# Patient Record
Sex: Female | Born: 1980 | Race: Black or African American | Hispanic: No | Marital: Married | Smoking: Former smoker
Health system: Southern US, Community
[De-identification: ages and names within clinical notes are randomized; demographics above are authoritative.]

## PROBLEM LIST (undated history)

## (undated) DIAGNOSIS — F32A Depression, unspecified: Secondary | ICD-10-CM

## (undated) DIAGNOSIS — F329 Major depressive disorder, single episode, unspecified: Secondary | ICD-10-CM

## (undated) DIAGNOSIS — A048 Other specified bacterial intestinal infections: Secondary | ICD-10-CM

## (undated) DIAGNOSIS — F419 Anxiety disorder, unspecified: Secondary | ICD-10-CM

## (undated) DIAGNOSIS — K449 Diaphragmatic hernia without obstruction or gangrene: Secondary | ICD-10-CM

## (undated) DIAGNOSIS — D649 Anemia, unspecified: Secondary | ICD-10-CM

## (undated) DIAGNOSIS — K219 Gastro-esophageal reflux disease without esophagitis: Secondary | ICD-10-CM

## (undated) DIAGNOSIS — M419 Scoliosis, unspecified: Secondary | ICD-10-CM

## (undated) HISTORY — PX: BARIATRIC SURGERY: SHX1103

---
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
## 2007-05-07 ENCOUNTER — Emergency Department: Payer: Self-pay

## 2007-05-09 ENCOUNTER — Emergency Department: Payer: Self-pay

## 2009-12-05 ENCOUNTER — Emergency Department: Payer: Self-pay | Admitting: Emergency Medicine

## 2009-12-26 ENCOUNTER — Emergency Department: Payer: Self-pay | Admitting: Emergency Medicine

## 2012-07-01 ENCOUNTER — Ambulatory Visit: Payer: Self-pay | Admitting: Family Medicine

## 2012-10-01 ENCOUNTER — Ambulatory Visit: Payer: Self-pay | Admitting: Obstetrics and Gynecology

## 2014-08-03 DIAGNOSIS — N946 Dysmenorrhea, unspecified: Secondary | ICD-10-CM | POA: Insufficient documentation

## 2014-08-03 DIAGNOSIS — N979 Female infertility, unspecified: Secondary | ICD-10-CM | POA: Insufficient documentation

## 2015-04-02 ENCOUNTER — Other Ambulatory Visit: Payer: Self-pay | Admitting: Family Medicine

## 2015-04-02 ENCOUNTER — Ambulatory Visit
Admission: RE | Admit: 2015-04-02 | Discharge: 2015-04-02 | Disposition: A | Discharge status: Home / Self Care | Payer: No Typology Code available for payment source | Source: Ambulatory Visit | Attending: Family Medicine | Admitting: Family Medicine

## 2015-04-02 DIAGNOSIS — D259 Leiomyoma of uterus, unspecified: Secondary | ICD-10-CM | POA: Insufficient documentation

## 2015-04-02 DIAGNOSIS — R1084 Generalized abdominal pain: Secondary | ICD-10-CM | POA: Diagnosis present

## 2015-04-02 MED ORDER — IOHEXOL 350 MG/ML SOLN
100.0000 mL | Freq: Once | INTRAVENOUS | Status: AC | PRN
Start: 2015-04-02 — End: 2015-04-02
  Administered 2015-04-02: 100 mL via INTRAVENOUS

## 2015-04-03 DIAGNOSIS — E611 Iron deficiency: Secondary | ICD-10-CM

## 2015-04-03 HISTORY — DX: Iron deficiency: E61.1

## 2015-06-30 ENCOUNTER — Encounter: Payer: Self-pay | Admitting: Emergency Medicine

## 2015-06-30 ENCOUNTER — Emergency Department: Payer: No Typology Code available for payment source

## 2015-06-30 ENCOUNTER — Emergency Department
Admission: EM | Admit: 2015-06-30 | Discharge: 2015-06-30 | Disposition: A | Discharge status: Home / Self Care | Payer: No Typology Code available for payment source | Attending: Emergency Medicine | Admitting: Emergency Medicine

## 2015-06-30 DIAGNOSIS — D259 Leiomyoma of uterus, unspecified: Secondary | ICD-10-CM | POA: Insufficient documentation

## 2015-06-30 DIAGNOSIS — Z791 Long term (current) use of non-steroidal anti-inflammatories (NSAID): Secondary | ICD-10-CM | POA: Insufficient documentation

## 2015-06-30 DIAGNOSIS — R102 Pelvic and perineal pain: Secondary | ICD-10-CM

## 2015-06-30 DIAGNOSIS — Z79899 Other long term (current) drug therapy: Secondary | ICD-10-CM | POA: Diagnosis not present

## 2015-06-30 DIAGNOSIS — Z880 Allergy status to penicillin: Secondary | ICD-10-CM | POA: Diagnosis not present

## 2015-06-30 DIAGNOSIS — Z87891 Personal history of nicotine dependence: Secondary | ICD-10-CM | POA: Diagnosis not present

## 2015-06-30 DIAGNOSIS — Z3202 Encounter for pregnancy test, result negative: Secondary | ICD-10-CM | POA: Diagnosis not present

## 2015-06-30 DIAGNOSIS — R109 Unspecified abdominal pain: Secondary | ICD-10-CM | POA: Diagnosis present

## 2015-06-30 HISTORY — DX: Scoliosis, unspecified: M41.9

## 2015-06-30 LAB — COMPREHENSIVE METABOLIC PANEL
ALT: 8 U/L — ABNORMAL LOW (ref 14–54)
ANION GAP: 5 (ref 5–15)
AST: 19 U/L (ref 15–41)
Albumin: 4 g/dL (ref 3.5–5.0)
Alkaline Phosphatase: 74 U/L (ref 38–126)
BUN: 13 mg/dL (ref 6–20)
CALCIUM: 8.5 mg/dL — AB (ref 8.9–10.3)
CO2: 26 mmol/L (ref 22–32)
Chloride: 107 mmol/L (ref 101–111)
Creatinine, Ser: 0.77 mg/dL (ref 0.44–1.00)
GFR calc Af Amer: >60 mL/min (ref >60)
GFR calc non Af Amer: >60 mL/min (ref >60)
Glucose, Bld: 104 mg/dL — ABNORMAL HIGH (ref 65–99)
Potassium: 3.9 mmol/L (ref 3.5–5.1)
SODIUM: 138 mmol/L (ref 135–145)
Total Bilirubin: 0.1 mg/dL — ABNORMAL LOW (ref 0.3–1.2)
Total Protein: 7.3 g/dL (ref 6.5–8.1)

## 2015-06-30 LAB — URINALYSIS COMPLETE WITH MICROSCOPIC (ARMC ONLY)
Bacteria, UA: NONE SEEN
Bilirubin Urine: NEGATIVE
GLUCOSE, UA: NEGATIVE mg/dL
Hgb urine dipstick: NEGATIVE
KETONES UR: NEGATIVE mg/dL
LEUKOCYTES UA: NEGATIVE
NITRITE: NEGATIVE
Protein, ur: NEGATIVE mg/dL
Specific Gravity, Urine: 1.019 (ref 1.005–1.030)
pH: 5 (ref 5.0–8.0)

## 2015-06-30 LAB — CBC
HCT: 39 % (ref 35.0–47.0)
HEMOGLOBIN: 12 g/dL (ref 12.0–16.0)
MCH: 24.4 pg — ABNORMAL LOW (ref 26.0–34.0)
MCHC: 30.9 g/dL — AB (ref 32.0–36.0)
MCV: 79 fL — AB (ref 80.0–100.0)
PLATELETS: 318 10*3/uL (ref 150–440)
RBC: 4.94 MIL/uL (ref 3.80–5.20)
RDW: 23.1 % — AB (ref 11.5–14.5)
WBC: 7.9 10*3/uL (ref 3.6–11.0)

## 2015-06-30 LAB — LIPASE, BLOOD: Lipase: 19 U/L — ABNORMAL LOW (ref 22–51)

## 2015-06-30 LAB — WET PREP, GENITAL
CLUE CELLS WET PREP: NONE SEEN
Trich, Wet Prep: NONE SEEN
YEAST WET PREP: NONE SEEN

## 2015-06-30 LAB — CHLAMYDIA/NGC RT PCR (ARMC ONLY)
Chlamydia Tr: NOT DETECTED
N gonorrhoeae: NOT DETECTED

## 2015-06-30 LAB — PREGNANCY, URINE: PREG TEST UR: NEGATIVE

## 2015-06-30 NOTE — ED Notes (Signed)
Patient with right lower quad abd pain that started yesterday. Patient denies nausea, vomiting or fevers.

## 2015-06-30 NOTE — ED Provider Notes (Signed)
[facility] Emergency Department Provider Note  ____________________________________________  Time seen: 7:30 AM  I have reviewed the triage vital signs and the nursing notes.   HISTORY  Chief Complaint Abdominal Pain    HPI Tammy Todd is a 34 y.o. female who presents with complaints of moderate right lower quadrant discomfort started yesterday. She reportedly was cramping in the morning so she stayed home from work and it improved during the day. When she woke up this morning and felt continued cramping so she decided to come to the emergency department. She denies fevers chills. No nausea vomiting. No vaginal discharge. No dysuria. Currently she does not have any pain which she attributes to taking a Vicodin at home     Past Medical History  Diagnosis Date  . Scoliosis     There are no active problems to display for this patient.   History reviewed. No pertinent past surgical history.  Current Outpatient Rx  Name  Route  Sig  Dispense  Refill  . diclofenac (VOLTAREN) 75 MG EC tablet   Oral   Take 75 mg by mouth 2 (two) times daily.         . ferrous fumarate (HEMOCYTE - 106 MG FE) 325 (106 FE) MG TABS tablet   Oral   Take 1 tablet by mouth 2 (two) times daily.         Marland Kitchen gabapentin (NEURONTIN) 300 MG capsule   Oral   Take 300 mg by mouth 3 (three) times daily.           Allergies Penicillins  No family history on file.  Social History Social History  Substance Use Topics  . Smoking status: Former Research scientist (life sciences)  . Smokeless tobacco: None  . Alcohol Use: No    Review of Systems  Constitutional: Negative for fever. Eyes: Negative for visual changes. ENT: Negative for sore throat Cardiovascular: Negative for chest pain. Respiratory: Negative for shortness of breath. Gastrointestinal: Positive for abdominal pain Genitourinary: Negative for dysuria. Musculoskeletal: Negative for back pain. Skin: Negative for  rash. Neurological: Negative for headaches  Psychiatric no anxiety    ____________________________________________   PHYSICAL EXAM:  VITAL SIGNS: ED Triage Vitals  Enc Vitals Group     BP 06/30/15 0501 119/74 mmHg     Pulse Rate 06/30/15 0501 60     Resp 06/30/15 0501 18     Temp 06/30/15 0501 98.1 F (36.7 C)     Temp Source 06/30/15 0501 Oral     SpO2 06/30/15 0501 100 %     Weight 06/30/15 0501 244 lb (110.678 kg)     Height 06/30/15 0501 5\' 4"  (1.626 m)     Head Cir --      Peak Flow --      Pain Score 06/30/15 0501 7     Pain Loc --      Pain Edu? --      Excl. in [place]? --      Constitutional: Alert and oriented. Well appearing and in no distress. Eyes: Conjunctivae are normal.  ENT   Head: Normocephalic and atraumatic.   Mouth/Throat: Mucous membranes are moist. Cardiovascular: Normal rate, regular rhythm. Normal and symmetric distal pulses are present in all extremities. No murmurs, rubs, or gallops. Respiratory: Normal respiratory effort without tachypnea nor retractions. Breath sounds are clear and equal bilaterally.  Gastrointestinal: Very mild right lower quadrant discomfort. No distention. There is no CVA tenderness. Genitourinary: Positive white discharge around the cervix. No CMT  Musculoskeletal: Nontender with normal range of motion in all extremities. No lower extremity tenderness nor edema. Neurologic:  Normal speech and language. No gross focal neurologic deficits are appreciated. Skin:  Skin is warm, dry and intact. No rash noted. Psychiatric: Mood and affect are normal. Patient exhibits appropriate insight and judgment.  ____________________________________________    LABS (pertinent positives/negatives)  Labs Reviewed  LIPASE, BLOOD - Abnormal; Notable for the following:    Lipase 19 (*)    All other components within normal limits  COMPREHENSIVE METABOLIC PANEL - Abnormal; Notable for the following:    Glucose, Bld 104 (*)    Calcium  8.5 (*)    ALT 8 (*)    Total Bilirubin 0.1 (*)    All other components within normal limits  CBC - Abnormal; Notable for the following:    MCV 79.0 (*)    MCH 24.4 (*)    MCHC 30.9 (*)    RDW 23.1 (*)    All other components within normal limits  URINALYSIS COMPLETEWITH MICROSCOPIC ([facility] ONLY) - Abnormal; Notable for the following:    Color, Urine YELLOW (*)    APPearance CLEAR (*)    Squamous Epithelial / LPF 0-5 (*)    All other components within normal limits  PREGNANCY, URINE    ____________________________________________   EKG  None  ____________________________________________    RADIOLOGY I have personally reviewed any xrays that were ordered on this patient: Ultrasound pelvis shows thickened endometrium. I discussed the patient and she is to follow-up with her GYN for repeat study ____________________________________________   PROCEDURES  Procedure(s) performed: none  Critical Care performed: none  ____________________________________________   INITIAL IMPRESSION / ASSESSMENT AND PLAN / ED COURSE  Pertinent labs & imaging results that were available during my care of the patient were reviewed by me and considered in my medical decision making (see chart for details).  Patient with mild right lower quadrant discomfort which resolved and then returned early this morning. Her labs are normal. This is not consistent with appendicitis given she really doesn't have tenderness to palpation it's more pelvic discomfort. We'll obtain ultrasound pelvis for suspected ovarian cyst  ____________________________________________   FINAL CLINICAL IMPRESSION(S) / ED DIAGNOSES  Final diagnoses:  Pelvic pain in female     Lavonia Drafts, MD 06/30/15 1356

## 2015-06-30 NOTE — Discharge Instructions (Signed)
Abdominal Pain, Women °Abdominal (stomach, pelvic, or belly) pain can be caused by many things. It is important to tell your doctor: °· The location of the pain. °· Does it come and go or is it present all the time? °· Are there things that start the pain (eating certain foods, exercise)? °· Are there other symptoms associated with the pain (fever, nausea, vomiting, diarrhea)? °All of this is helpful to know when trying to find the cause of the pain. °CAUSES  °· Stomach: virus or bacteria infection, or ulcer. °· Intestine: appendicitis (inflamed appendix), regional ileitis (Crohn's disease), ulcerative colitis (inflamed colon), irritable bowel syndrome, diverticulitis (inflamed diverticulum of the colon), or cancer of the stomach or intestine. °· Gallbladder disease or stones in the gallbladder. °· Kidney disease, kidney stones, or infection. °· Pancreas infection or cancer. °· Fibromyalgia (pain disorder). °· Diseases of the female organs: °¨ Uterus: fibroid (non-cancerous) tumors or infection. °¨ Fallopian tubes: infection or tubal pregnancy. °¨ Ovary: cysts or tumors. °¨ Pelvic adhesions (scar tissue). °¨ Endometriosis (uterus lining tissue growing in the pelvis and on the pelvic organs). °¨ Pelvic congestion syndrome (female organs filling up with blood just before the menstrual period). °¨ Pain with the menstrual period. °¨ Pain with ovulation (producing an egg). °¨ Pain with an IUD (intrauterine device, birth control) in the uterus. °¨ Cancer of the female organs. °· Functional pain (pain not caused by a disease, may improve without treatment). °· Psychological pain. °· Depression. °DIAGNOSIS  °Your doctor will decide the seriousness of your pain by doing an examination. °· Blood tests. °· X-rays. °· Ultrasound. °· CT scan (computed tomography, special type of X-ray). °· MRI (magnetic resonance imaging). °· Cultures, for infection. °· Barium enema (dye inserted in the large intestine, to better view it with  X-rays). °· Colonoscopy (looking in intestine with a lighted tube). °· Laparoscopy (minor surgery, looking in abdomen with a lighted tube). °· Major abdominal exploratory surgery (looking in abdomen with a large incision). °TREATMENT  °The treatment will depend on the cause of the pain.  °· Many cases can be observed and treated at home. °· Over-the-counter medicines recommended by your caregiver. °· Prescription medicine. °· Antibiotics, for infection. °· Birth control pills, for painful periods or for ovulation pain. °· Hormone treatment, for endometriosis. °· Nerve blocking injections. °· Physical therapy. °· Antidepressants. °· Counseling with a psychologist or psychiatrist. °· Minor or major surgery. °HOME CARE INSTRUCTIONS  °· Do not take laxatives, unless directed by your caregiver. °· Take over-the-counter pain medicine only if ordered by your caregiver. Do not take aspirin because it can cause an upset stomach or bleeding. °· Try a clear liquid diet (broth or water) as ordered by your caregiver. Slowly move to a bland diet, as tolerated, if the pain is related to the stomach or intestine. °· Have a thermometer and take your temperature several times a day, and record it. °· Bed rest and sleep, if it helps the pain. °· Avoid sexual intercourse, if it causes pain. °· Avoid stressful situations. °· Keep your follow-up appointments and tests, as your caregiver orders. °· If the pain does not go away with medicine or surgery, you may try: °¨ Acupuncture. °¨ Relaxation exercises (yoga, meditation). °¨ Group therapy. °¨ Counseling. °SEEK MEDICAL CARE IF:  °· You notice certain foods cause stomach pain. °· Your home care treatment is not helping your pain. °· You need stronger pain medicine. °· You want your IUD removed. °· You feel faint or   lightheaded. °· You develop nausea and vomiting. °· You develop a rash. °· You are having side effects or an allergy to your medicine. °SEEK IMMEDIATE MEDICAL CARE IF:  °· Your  pain does not go away or gets worse. °· You have a fever. °· Your pain is felt only in portions of the abdomen. The right side could possibly be appendicitis. The left lower portion of the abdomen could be colitis or diverticulitis. °· You are passing blood in your stools (bright red or black tarry stools, with or without vomiting). °· You have blood in your urine. °· You develop chills, with or without a fever. °· You pass out. °MAKE SURE YOU:  °· Understand these instructions. °· Will watch your condition. °· Will get help right away if you are not doing well or get worse. °Document Released: 08/31/2007 Document Revised: 03/20/2014 Document Reviewed: 09/20/2009 °[employer]® Patient Information ©2015 [employer]. This information is not intended to replace advice given to you by your health care provider. Make sure you discuss any questions you have with your health care provider. ° °

## 2015-06-30 NOTE — ED Notes (Signed)
Pt back from U/S, laying in bed comfortably. Denies needs at this time. Will continue to assess.

## 2018-06-03 ENCOUNTER — Emergency Department
Admission: EM | Admit: 2018-06-03 | Discharge: 2018-06-03 | Disposition: A | Discharge status: Home / Self Care | Payer: BLUE CROSS/BLUE SHIELD | Attending: Emergency Medicine | Admitting: Emergency Medicine

## 2018-06-03 ENCOUNTER — Encounter: Payer: Self-pay | Admitting: Emergency Medicine

## 2018-06-03 ENCOUNTER — Emergency Department: Payer: BLUE CROSS/BLUE SHIELD

## 2018-06-03 DIAGNOSIS — R0602 Shortness of breath: Secondary | ICD-10-CM | POA: Insufficient documentation

## 2018-06-03 DIAGNOSIS — Z87891 Personal history of nicotine dependence: Secondary | ICD-10-CM | POA: Insufficient documentation

## 2018-06-03 DIAGNOSIS — R42 Dizziness and giddiness: Secondary | ICD-10-CM | POA: Diagnosis not present

## 2018-06-03 DIAGNOSIS — Z79899 Other long term (current) drug therapy: Secondary | ICD-10-CM | POA: Insufficient documentation

## 2018-06-03 DIAGNOSIS — R55 Syncope and collapse: Secondary | ICD-10-CM | POA: Diagnosis not present

## 2018-06-03 DIAGNOSIS — R531 Weakness: Secondary | ICD-10-CM | POA: Diagnosis present

## 2018-06-03 LAB — COMPREHENSIVE METABOLIC PANEL
ALT: 8 U/L (ref 0–44)
ANION GAP: 7 (ref 5–15)
AST: 15 U/L (ref 15–41)
Albumin: 3.5 g/dL (ref 3.5–5.0)
Alkaline Phosphatase: 65 U/L (ref 38–126)
BUN: 15 mg/dL (ref 6–20)
CO2: 23 mmol/L (ref 22–32)
Calcium: 9.2 mg/dL (ref 8.9–10.3)
Chloride: 112 mmol/L — ABNORMAL HIGH (ref 98–111)
Creatinine, Ser: 0.72 mg/dL (ref 0.44–1.00)
GFR calc non Af Amer: >60 mL/min (ref >60)
Glucose, Bld: 114 mg/dL — ABNORMAL HIGH (ref 70–99)
POTASSIUM: 4 mmol/L (ref 3.5–5.1)
Sodium: 142 mmol/L (ref 135–145)
TOTAL PROTEIN: 7 g/dL (ref 6.5–8.1)
Total Bilirubin: 0.2 mg/dL — ABNORMAL LOW (ref 0.3–1.2)

## 2018-06-03 LAB — CBC
HEMATOCRIT: 36.3 % (ref 35.0–47.0)
Hemoglobin: 12 g/dL (ref 12.0–16.0)
MCH: 26.6 pg (ref 26.0–34.0)
MCHC: 33.1 g/dL (ref 32.0–36.0)
MCV: 80.3 fL (ref 80.0–100.0)
Platelets: 308 10*3/uL (ref 150–440)
RBC: 4.52 MIL/uL (ref 3.80–5.20)
RDW: 17.6 % — AB (ref 11.5–14.5)
WBC: 7 10*3/uL (ref 3.6–11.0)

## 2018-06-03 LAB — TROPONIN I

## 2018-06-03 MED ORDER — IOHEXOL 300 MG/ML  SOLN
75.0000 mL | Freq: Once | INTRAMUSCULAR | Status: AC | PRN
  Administered 2018-06-03: 75 mL via INTRAVENOUS

## 2018-06-03 MED ORDER — ONDANSETRON HCL 4 MG/2ML IJ SOLN
4.0000 mg | Freq: Once | INTRAMUSCULAR | Status: AC
  Administered 2018-06-03: 4 mg via INTRAVENOUS
  Filled 2018-06-03: qty 2

## 2018-06-03 MED ORDER — SODIUM CHLORIDE 0.9 % IV SOLN
1000.0000 mL | Freq: Once | INTRAVENOUS | Status: AC
  Administered 2018-06-03: 1000 mL via INTRAVENOUS

## 2018-06-03 NOTE — ED Triage Notes (Signed)
Pt reports for several days has felt weak, dizzy and nauseated. Denies pain.

## 2018-06-03 NOTE — ED Notes (Signed)
ED Provider at bedside. 

## 2018-06-03 NOTE — ED Provider Notes (Signed)
[facility] Emergency Department Provider Note   ____________________________________________    I have reviewed the triage vital signs and the nursing notes.   HISTORY  Chief Complaint Weakness; Nausea; and Dizziness     HPI Tammy Todd is a 37 y.o. female who presents with complaints of weakness nausea and some shortness of breath.  Patient reports approximate 3 days ago she started feeling intermittent lightheadedness and diffusely weak.  She denies fevers or chills.  She does note some mild shortness of breath but this appears to be very gradually worsening over a long period of time.  She does not smoke.  No calf pain or swelling.  No pleurisy.  No chest pain.  No palpitations.  Today she got up and got in the shower and felt very hot and lightheaded and vomited once.  Currently she feels better.  No recent travel.  No history of heart disease.  She states she is not pregnant.  Has noticed a cough today   Past Medical History:  Diagnosis Date  . Scoliosis     There are no active problems to display for this patient.   History reviewed. No pertinent surgical history.  Prior to Admission medications   Medication Sig Start Date End Date Taking? Authorizing Provider  diclofenac (VOLTAREN) 75 MG EC tablet Take 75 mg by mouth 2 (two) times daily.    [provider]  ferrous fumarate (HEMOCYTE - 106 MG FE) 325 (106 FE) MG TABS tablet Take 1 tablet by mouth 2 (two) times daily.    [provider]  gabapentin (NEURONTIN) 300 MG capsule Take 300 mg by mouth 3 (three) times daily.    [provider]     Allergies Penicillins  No family history on file.  Social History Social History   Tobacco Use  . Smoking status: Former Smoker  Substance Use Topics  . Alcohol use: No  . Drug use: No    Review of Systems  Constitutional: No fever/chills Eyes: No visual changes.  ENT: No sore throat. Cardiovascular: No  chest pain no pleurisy no palpitations Respiratory: As above Gastrointestinal: No abdominal pain.  Positive nausea Genitourinary: Negative for dysuria. Musculoskeletal: Negative for calf pain Skin: Negative for rash. Neurological: Negative for headaches    ____________________________________________   PHYSICAL EXAM:  VITAL SIGNS: ED Triage Vitals  Enc Vitals Group     BP 06/03/18 0804 133/84     Pulse Rate 06/03/18 0804 74     Resp 06/03/18 0804 17     Temp 06/03/18 0804 98.7 F (37.1 C)     Temp Source 06/03/18 0804 Oral     SpO2 06/03/18 0804 99 %     Weight 06/03/18 0756 113.9 kg (251 lb)     Height 06/03/18 0756 1.6 m (5\' 3" )     Head Circumference --      Peak Flow --      Pain Score 06/03/18 0756 0     Pain Loc --      Pain Edu? --      Excl. in [place]? --     Constitutional: Alert and oriented. No acute distress. Pleasant and interactive  Nose: No congestion/rhinnorhea. Mouth/Throat: Mucous membranes are moist.   Cardiovascular: Normal rate, regular rhythm. Grossly normal heart sounds.  Good peripheral circulation. Respiratory: Normal respiratory effort.  No retractions. Lungs CTAB. Gastrointestinal: Soft and nontender. No distention.  No CVA tenderness. Genitourinary: deferred Musculoskeletal: No lower extremity tenderness nor edema.  Warm and well perfused Neurologic:  Normal speech and language. No gross focal neurologic deficits are appreciated.  Skin:  Skin is warm, dry and intact. No rash noted. Psychiatric: Mood and affect are normal. Speech and behavior are normal.  ____________________________________________   LABS (all labs ordered are listed, but only abnormal results are displayed)  Labs Reviewed  CBC - Abnormal; Notable for the following components:      Result Value   RDW 17.6 (*)    All other components within normal limits  COMPREHENSIVE METABOLIC PANEL - Abnormal; Notable for the following components:   Chloride 112 (*)    Glucose, Bld  114 (*)    Total Bilirubin 0.2 (*)    All other components within normal limits  TROPONIN I  URINALYSIS, COMPLETE (UACMP) WITH MICROSCOPIC   ____________________________________________  EKG  ED ECG REPORT I, Lavonia Drafts, the attending physician, personally viewed and interpreted this ECG.  Date: 06/03/2018  Rhythm: normal sinus rhythm QRS Axis: normal Intervals: normal ST/T Wave abnormalities: normal Narrative Interpretation: no evidence of acute ischemia  ____________________________________________  RADIOLOGY  Chest x-ray demonstrates asymmetric opacity CT chest is normal ____________________________________________   PROCEDURES  Procedure(s) performed: No  Procedures   Critical Care performed: No ____________________________________________   INITIAL IMPRESSION / ASSESSMENT AND PLAN / ED COURSE  Pertinent labs & imaging results that were available during my care of the patient were reviewed by me and considered in my medical decision making (see chart for details).  Patient well-appearing and in no acute distress.  Exam is reassuring.  Vital signs unremarkable.  Will check labs, chest x-ray, place the patient on cardiac monitor, give IV fluids and Zofran and reevaluate.  She did have an episode of diarrhea yesterday so I am suspicious of possible GI illness  ----------------------------------------- 10:10 AM on 06/03/2018 -----------------------------------------  Lab work is overall quite reassuring.  On chest x-ray asymmetric opacity noted by radiology, will send for CT with IV contrast given no evidence of infection  CT chest unremarkable, patient is reassured by this.  She feels quite well, will discharge with outpatient follow-up.  Return precautions discussed    ____________________________________________   FINAL CLINICAL IMPRESSION(S) / ED DIAGNOSES  Final diagnoses:  Near syncope  Dizziness        Note:  This document was prepared  using Dragon voice recognition software and may include unintentional dictation errors.    Lavonia Drafts, MD 06/03/18 1302

## 2018-11-03 DIAGNOSIS — Z6841 Body Mass Index (BMI) 40.0 and over, adult: Secondary | ICD-10-CM

## 2018-11-03 DIAGNOSIS — Z713 Dietary counseling and surveillance: Secondary | ICD-10-CM | POA: Insufficient documentation

## 2018-11-03 DIAGNOSIS — R635 Abnormal weight gain: Secondary | ICD-10-CM

## 2018-11-03 DIAGNOSIS — F411 Generalized anxiety disorder: Secondary | ICD-10-CM | POA: Insufficient documentation

## 2018-11-03 DIAGNOSIS — G8929 Other chronic pain: Secondary | ICD-10-CM | POA: Insufficient documentation

## 2018-11-03 HISTORY — DX: Morbid (severe) obesity due to excess calories: E66.01

## 2018-11-03 HISTORY — DX: Body Mass Index (BMI) 40.0 and over, adult: Z684

## 2018-11-03 HISTORY — DX: Abnormal weight gain: R63.5

## 2018-12-13 DIAGNOSIS — E559 Vitamin D deficiency, unspecified: Secondary | ICD-10-CM | POA: Insufficient documentation

## 2018-12-27 ENCOUNTER — Emergency Department
Admission: EM | Admit: 2018-12-27 | Discharge: 2018-12-27 | Disposition: A | Discharge status: Home / Self Care | Payer: 59 | Attending: Emergency Medicine | Admitting: Emergency Medicine

## 2018-12-27 ENCOUNTER — Other Ambulatory Visit: Payer: Self-pay

## 2018-12-27 ENCOUNTER — Encounter: Payer: Self-pay | Admitting: Emergency Medicine

## 2018-12-27 DIAGNOSIS — Z87891 Personal history of nicotine dependence: Secondary | ICD-10-CM | POA: Insufficient documentation

## 2018-12-27 DIAGNOSIS — M419 Scoliosis, unspecified: Secondary | ICD-10-CM | POA: Diagnosis not present

## 2018-12-27 DIAGNOSIS — M5442 Lumbago with sciatica, left side: Secondary | ICD-10-CM | POA: Diagnosis not present

## 2018-12-27 DIAGNOSIS — Z79899 Other long term (current) drug therapy: Secondary | ICD-10-CM | POA: Diagnosis not present

## 2018-12-27 DIAGNOSIS — G8929 Other chronic pain: Secondary | ICD-10-CM

## 2018-12-27 DIAGNOSIS — M5440 Lumbago with sciatica, unspecified side: Secondary | ICD-10-CM

## 2018-12-27 DIAGNOSIS — M5489 Other dorsalgia: Secondary | ICD-10-CM | POA: Diagnosis present

## 2018-12-27 MED ORDER — LIDOCAINE 5 % EX PTCH
1.0000 | MEDICATED_PATCH | CUTANEOUS | Status: DC
  Administered 2018-12-27: 1 via TRANSDERMAL
  Filled 2018-12-27: qty 1

## 2018-12-27 MED ORDER — OXYCODONE-ACETAMINOPHEN 7.5-325 MG PO TABS: 1.0000 | ORAL_TABLET | Freq: Four times a day (QID) | ORAL | 0 refills | Status: DC | PRN

## 2018-12-27 NOTE — ED Triage Notes (Signed)
Pt states lower back pain that began in Oct, burning in mid back started a little after that and progressively gotten worse. Pt walked to triage with no issues, NAD.

## 2018-12-27 NOTE — ED Provider Notes (Signed)
[facility] Emergency Department Provider Note   ____________________________________________   First MD Initiated Contact with Patient 12/27/18 [phone]     (approximate)  I have reviewed the triage vital signs and the nursing notes.   HISTORY  Chief Complaint Back Pain    HPI Tammy Todd is a 37 y.o. female patient presents with chronic back pain  with history of scoliosis.  Patient also states there is radicular component to the left lower extremity.  Patient denies bladder bowel dysfunction.  Patient is scheduled to see orthopedic for definitive evaluation and treatment in 2 days.  Patient is requesting pain relief until seen by orthopedics.  Patient rates the pain as a 7/10.  Patient described pain is "sharp".  No palliative measures for complaint.   Past Medical History:  Diagnosis Date  . Scoliosis     There are no active problems to display for this patient.   History reviewed. No pertinent surgical history.  Prior to Admission medications   Medication Sig Start Date End Date Taking? Authorizing Provider  diclofenac (VOLTAREN) 75 MG EC tablet Take 75 mg by mouth 2 (two) times daily.    [provider]  ferrous fumarate (HEMOCYTE - 106 MG FE) 325 (106 FE) MG TABS tablet Take 1 tablet by mouth 2 (two) times daily.    [provider]  gabapentin (NEURONTIN) 300 MG capsule Take 300 mg by mouth 3 (three) times daily.    [provider]  oxyCODONE-acetaminophen (PERCOCET) 7.5-325 MG tablet Take 1 tablet by mouth every 6 (six) hours as needed for severe pain. 12/27/18   Sable Feil, PA-C    Allergies Penicillins and Peanut-containing drug products  No family history on file.  Social History Social History   Tobacco Use  . Smoking status: Former Research scientist (life sciences)  . Smokeless tobacco: Never Used  Substance Use Topics  . Alcohol use: No  . Drug use: No    Review of Systems Constitutional: No fever/chills Eyes: No  visual changes. ENT: No sore throat. Cardiovascular: Denies chest pain. Respiratory: Denies shortness of breath. Gastrointestinal: No abdominal pain.  No nausea, no vomiting.  No diarrhea.  No constipation. Genitourinary: Negative for dysuria. Musculoskeletal: Positive for back pain. Skin: Negative for rash. Neurological: Negative for headaches, focal weakness or numbness. Allergic/Immunilogical: Penicillin and peanuts. ____________________________________________   PHYSICAL EXAM:  VITAL SIGNS: ED Triage Vitals  Enc Vitals Group     BP 12/27/18 0741 126/78     Pulse Rate 12/27/18 0741 85     Resp 12/27/18 0741 12     Temp 12/27/18 0741 98.5 F (36.9 C)     Temp Source 12/27/18 0741 Oral     SpO2 12/27/18 0741 99 %     Weight 12/27/18 0737 248 lb (112.5 kg)     Height 12/27/18 0737 5\' 3"  (1.6 m)     Head Circumference --      Peak Flow --      Pain Score 12/27/18 0737 7     Pain Loc --      Pain Edu? --      Excl. in [place]? --     Constitutional: Alert and oriented. Well appearing and in no acute distress. Cardiovascular: Normal rate, regular rhythm. Grossly normal heart sounds.  Good peripheral circulation. Respiratory: Normal respiratory effort.  No retractions. Lungs CTAB. Musculoskeletal: No lower extremity tenderness nor edema.  No joint effusions. Neurologic:  Normal speech and language. No gross focal neurologic deficits are appreciated.  No gait instability. Skin:  Skin is warm, dry and intact. No rash noted. Psychiatric: Mood and affect are normal. Speech and behavior are normal.  ____________________________________________   LABS (all labs ordered are listed, but only abnormal results are displayed)  Labs Reviewed - No data to display ____________________________________________  EKG   ____________________________________________  RADIOLOGY  ED MD interpretation:    Official radiology report(s): No results  found.  ____________________________________________   PROCEDURES  Procedure(s) performed: None  Procedures  Critical Care performed: No  ____________________________________________   INITIAL IMPRESSION / ASSESSMENT AND PLAN / ED COURSE  As part of my medical decision making, I reviewed the following data within the electronic MEDICAL RECORD NUMBER     Back pain secondary to scoliosis.  Patient given discharge care instruction advised to follow-up with schedule orthopedic appointment in 2 days.  Take medication as directed.      ____________________________________________   FINAL CLINICAL IMPRESSION(S) / ED DIAGNOSES  Final diagnoses:  Chronic midline low back pain with sciatica, sciatica laterality unspecified     ED Discharge Orders         Ordered    oxyCODONE-acetaminophen (PERCOCET) 7.5-325 MG tablet  Every 6 hours PRN     12/27/18 0819           Note:  This document was prepared using Dragon voice recognition software and may include unintentional dictation errors.    Sable Feil, PA-C 12/27/18 Marijo Conception    Lavonia Drafts, MD 12/27/18 [phone]

## 2018-12-27 NOTE — ED Notes (Signed)
See triage note  Presents with lower back pain  States pain started in Oct  describes as a burning pain  Ambulates well   Denies any recent injury

## 2019-08-26 ENCOUNTER — Encounter: Payer: Self-pay | Admitting: Podiatry

## 2019-08-26 ENCOUNTER — Ambulatory Visit: Payer: 59 | Admitting: Podiatry

## 2019-08-26 ENCOUNTER — Other Ambulatory Visit: Payer: Self-pay

## 2019-08-26 ENCOUNTER — Ambulatory Visit: Payer: 59

## 2019-08-26 DIAGNOSIS — R234 Changes in skin texture: Secondary | ICD-10-CM | POA: Diagnosis not present

## 2019-08-26 DIAGNOSIS — B353 Tinea pedis: Secondary | ICD-10-CM

## 2019-08-26 MED ORDER — TERBINAFINE HCL 250 MG PO TABS: 250.0000 mg | ORAL_TABLET | Freq: Every day | ORAL | 0 refills | Status: DC

## 2019-08-29 ENCOUNTER — Other Ambulatory Visit: Payer: Self-pay

## 2019-08-29 ENCOUNTER — Ambulatory Visit
Admission: EM | Admit: 2019-08-29 | Discharge: 2019-08-29 | Disposition: A | Discharge status: Home / Self Care | Payer: 59 | Attending: Family Medicine | Admitting: Family Medicine

## 2019-08-29 DIAGNOSIS — R05 Cough: Secondary | ICD-10-CM | POA: Diagnosis not present

## 2019-08-29 DIAGNOSIS — R0982 Postnasal drip: Secondary | ICD-10-CM | POA: Diagnosis not present

## 2019-08-29 DIAGNOSIS — R059 Cough, unspecified: Secondary | ICD-10-CM

## 2019-08-29 HISTORY — DX: Anemia, unspecified: D64.9

## 2019-08-29 HISTORY — DX: Gastro-esophageal reflux disease without esophagitis: K21.9

## 2019-08-29 HISTORY — DX: Diaphragmatic hernia without obstruction or gangrene: K44.9

## 2019-08-29 HISTORY — DX: Anxiety disorder, unspecified: F41.9

## 2019-08-29 HISTORY — DX: Other specified bacterial intestinal infections: A04.8

## 2019-08-29 MED ORDER — BENZONATATE 200 MG PO CAPS: 200.0000 mg | ORAL_CAPSULE | Freq: Three times a day (TID) | ORAL | 0 refills | Status: DC | PRN

## 2019-08-29 NOTE — ED Provider Notes (Signed)
[facility]    CSN: [id number] Arrival date & time: 08/29/19  0825      History   Chief Complaint Chief Complaint  Patient presents with  . Shortness of Breath    HPI Tammy Todd is a 38 y.o. female.   38 yo female with a c/o cough, shortness of breath and throat congestion sensation. Denies any fevers, chills, wheezing. States cough is worse at night. Denies any known sick contacts.   Shortness of Breath   Past Medical History:  Diagnosis Date  . Anemia   . Anxiety   . GERD (gastroesophageal reflux disease)   . H. pylori infection   . Hiatal hernia   . Scoliosis     Patient Active Problem List   Diagnosis Date Noted  . Vitamin D deficiency 12/13/2018  . Abnormal weight gain 11/03/2018  . Chronic midline low back pain without sciatica 11/03/2018  . Class 3 severe obesity due to excess calories with serious comorbidity and body mass index (BMI) of 45.0 to 49.9 in adult ([place]) 11/03/2018  . Encounter for weight loss counseling 11/03/2018  . Generalized anxiety disorder 11/03/2018  . Iron deficiency 04/03/2015  . Dysmenorrhea 08/03/2014  . Female fertility problem 08/03/2014    History reviewed. No pertinent surgical history.  OB History   No obstetric history on file.      Home Medications    Prior to Admission medications   Medication Sig Start Date End Date Taking? Authorizing Provider  Ascorbic Acid (VITAMIN C) 100 MG tablet Take by mouth.    [provider]  benzonatate (TESSALON) 200 MG capsule Take 1 capsule (200 mg total) by mouth 3 (three) times daily as needed. 08/29/19   Norval Gable, MD  buPROPion [facility] SR) 150 MG 12 hr tablet Take by mouth. 11/03/18   [provider]  cyclobenzaprine (FLEXERIL) 10 MG tablet cyclobenzaprine 10 mg tablet 09/08/18   [provider]  ferrous fumarate (HEMOCYTE - 106 MG FE) 325 (106 FE) MG TABS tablet Take 1 tablet by mouth 2 (two) times daily.    [provider]  gabapentin (NEURONTIN) 300 MG capsule Take 300 mg by mouth 3 (three) times daily.    [provider]  hydrOXYzine (ATARAX/VISTARIL) 10 MG tablet hydroxyzine HCl 10 mg tablet 07/18/18   [provider]  meloxicam (MOBIC) 15 MG tablet  10/05/18   [provider]  sertraline (ZOLOFT) 50 MG tablet  10/05/18   [provider]  terbinafine (LAMISIL) 250 MG tablet Take 1 tablet (250 mg total) by mouth daily. 08/26/19   Edrick Kins, DPM  traMADol Veatrice Bourbon) 50 MG tablet tramadol 50 mg tablet 09/08/18   [provider]    Family History History reviewed. No pertinent family history.  Social History Social History   Tobacco Use  . Smoking status: Former Research scientist (life sciences)  . Smokeless tobacco: Never Used  Substance Use Topics  . Alcohol use: Yes    Comment: social  . Drug use: No     Allergies   Cashew nut oil, Penicillins, Pistachio nut extract skin test, and Peanut-containing drug products   Review of Systems Review of Systems  Respiratory: Positive for shortness of breath.      Physical Exam Triage Vital Signs ED Triage Vitals  Enc Vitals Group     BP 08/29/19 0833 133/85     Pulse Rate 08/29/19 0833 93     Resp 08/29/19 0833 20     Temp 08/29/19 [id number]  98.5 F (36.9 C)     Temp Source 08/29/19 0833 Oral     SpO2 08/29/19 0833 97 %     Weight 08/29/19 0834 271 lb (122.9 kg)     Height 08/29/19 0834 5\' 3"  (1.6 m)     Head Circumference --      Peak Flow --      Pain Score 08/29/19 0834 0     Pain Loc --      Pain Edu? --      Excl. in [place]? --    No data found.  Updated Vital Signs BP 133/85 (BP Location: Left Arm)   Pulse 93   Temp 98.5 F (36.9 C) (Oral)   Resp 20   Ht 5\' 3"  (1.6 m)   Wt 122.9 kg   LMP 08/15/2019   SpO2 97%   BMI 48.01 kg/m   Visual Acuity Right Eye Distance:   Left Eye Distance:   Bilateral Distance:    Right Eye Near:   Left Eye Near:    Bilateral Near:     Physical Exam Vitals  signs and nursing note reviewed.  Constitutional:      General: She is not in acute distress.    Appearance: She is not ill-appearing, toxic-appearing or diaphoretic.  HENT:     Mouth/Throat:     Comments: Post nasal drainage Cardiovascular:     Rate and Rhythm: Normal rate and regular rhythm.  Pulmonary:     Effort: Pulmonary effort is normal. No respiratory distress.     Breath sounds: Normal breath sounds. No stridor. No wheezing, rhonchi or rales.  Neurological:     Mental Status: She is alert.      UC Treatments / Results  Labs (all labs ordered are listed, but only abnormal results are displayed) Labs Reviewed  NOVEL CORONAVIRUS, NAA (HOSP ORDER, SEND-OUT TO REF LAB; TAT 18-24 HRS)    EKG   Radiology No results found.  Procedures Procedures (including critical care time)  Medications Ordered in UC Medications - No data to display  Initial Impression / Assessment and Plan / UC Course  I have reviewed the triage vital signs and the nursing notes.  Pertinent labs & imaging results that were available during my care of the patient were reviewed by me and considered in my medical decision making (see chart for details).      Final Clinical Impressions(s) / UC Diagnoses   Final diagnoses:  Cough  Post-nasal drainage     Discharge Instructions     Over the counter steroid nasal spray and oral allergy medication Increase water intake    ED Prescriptions    Medication Sig Dispense Auth. Provider   benzonatate (TESSALON) 200 MG capsule Take 1 capsule (200 mg total) by mouth 3 (three) times daily as needed. 30 capsule Norval Gable, MD      1. diagnosis reviewed with patient 2. rx as per orders above; reviewed possible side effects, interactions, risks and benefits  3. Recommend supportive treatment as above 4. covid test done 5. Follow-up prn if symptoms worsen or don't improve  PDMP not reviewed this encounter.   Norval Gable, MD 08/29/19  [phone]

## 2019-08-29 NOTE — Discharge Instructions (Signed)
Over the counter steroid nasal spray and oral allergy medication Increase water intake

## 2019-08-29 NOTE — Progress Notes (Signed)
   HPI: 38 y.o. female presenting today as a new patient with a chief complaint of stinging, burning pain noted to the bilateral heels that began a few years ago. She reports severely dry, cracking skin of the heels. She reports some associated drainage, erythema and inflammation. Walking sometimes increases the pain. She has been using OTC lotions, soaking and applying Vaseline with no relief. Patient is here for further evaluation and treatment.   Past Medical History:  Diagnosis Date  . Anemia   . Anxiety   . GERD (gastroesophageal reflux disease)   . H. pylori infection   . Hiatal hernia   . Scoliosis      Physical Exam: General: The patient is alert and oriented x3 in no acute distress.  Dermatology: Pruritus noted to the bilateral heels of the with hyperkeratosis. Heel fissures noted bilaterally. Skin is warm, dry and supple bilateral lower extremities. Negative for open lesions or macerations.  Vascular: Palpable pedal pulses bilaterally. No edema or erythema noted. Capillary refill within normal limits.  Neurological: Epicritic and protective threshold grossly intact bilaterally.   Musculoskeletal Exam: Range of motion within normal limits to all pedal and ankle joints bilateral. Muscle strength 5/5 in all groups bilateral.   Assessment: 1. Tinea pedis bilateral 2. Heel fissures bilateral    Plan of Care:  1. Patient evaluated. X-Rays reviewed.  2. Prescription for Lamisil 250 mg #45 provided to patient.  3. Revitaderm 40% Urea provided to patient.  4. Recommended not going barefoot.  5. Return to clinic in 6 weeks.       Edrick Kins, DPM [facility]  Dr. Edrick Kins, DPM    [address], [place] [address]                Office [phone]  Fax [phone]

## 2019-08-29 NOTE — ED Triage Notes (Signed)
Shortness of breath x past few days. Dry cough and difficulty swallowing. "Feels tight when I swallow, but no pain." Pt states the cough sounds like her bronchitis. No fever.

## 2019-08-30 LAB — NOVEL CORONAVIRUS, NAA (HOSP ORDER, SEND-OUT TO REF LAB; TAT 18-24 HRS): SARS-CoV-2, NAA: NOT DETECTED

## 2019-09-07 DIAGNOSIS — G4719 Other hypersomnia: Secondary | ICD-10-CM | POA: Insufficient documentation

## 2019-09-07 DIAGNOSIS — K449 Diaphragmatic hernia without obstruction or gangrene: Secondary | ICD-10-CM | POA: Insufficient documentation

## 2019-09-07 DIAGNOSIS — R0683 Snoring: Secondary | ICD-10-CM | POA: Insufficient documentation

## 2019-09-07 DIAGNOSIS — K219 Gastro-esophageal reflux disease without esophagitis: Secondary | ICD-10-CM | POA: Insufficient documentation

## 2019-09-19 ENCOUNTER — Other Ambulatory Visit
Admission: RE | Admit: 2019-09-19 | Discharge: 2019-09-19 | Disposition: A | Discharge status: Home / Self Care | Payer: 59 | Source: Ambulatory Visit | Attending: Internal Medicine | Admitting: Internal Medicine

## 2019-09-19 ENCOUNTER — Other Ambulatory Visit: Payer: Self-pay

## 2019-09-19 DIAGNOSIS — Z01812 Encounter for preprocedural laboratory examination: Secondary | ICD-10-CM | POA: Diagnosis not present

## 2019-09-19 DIAGNOSIS — Z20828 Contact with and (suspected) exposure to other viral communicable diseases: Secondary | ICD-10-CM | POA: Insufficient documentation

## 2019-09-20 LAB — SARS CORONAVIRUS 2 (TAT 6-24 HRS): SARS Coronavirus 2: NEGATIVE

## 2019-09-21 ENCOUNTER — Encounter: Payer: Self-pay | Admitting: *Deleted

## 2019-09-22 ENCOUNTER — Encounter: Payer: Self-pay | Admitting: Certified Registered Nurse Anesthetist

## 2019-09-22 ENCOUNTER — Other Ambulatory Visit: Payer: Self-pay

## 2019-09-22 ENCOUNTER — Encounter
Admission: RE | Disposition: A | Discharge status: Home / Self Care | Payer: Self-pay | Source: Home / Self Care | Attending: Internal Medicine

## 2019-09-22 ENCOUNTER — Ambulatory Visit
Admission: RE | Admit: 2019-09-22 | Discharge: 2019-09-22 | Disposition: A | Discharge status: Home / Self Care | Payer: 59 | Attending: Internal Medicine | Admitting: Internal Medicine

## 2019-09-22 ENCOUNTER — Ambulatory Visit: Payer: 59 | Admitting: Certified Registered Nurse Anesthetist

## 2019-09-22 DIAGNOSIS — K640 First degree hemorrhoids: Secondary | ICD-10-CM | POA: Insufficient documentation

## 2019-09-22 DIAGNOSIS — F329 Major depressive disorder, single episode, unspecified: Secondary | ICD-10-CM | POA: Insufficient documentation

## 2019-09-22 DIAGNOSIS — Z791 Long term (current) use of non-steroidal anti-inflammatories (NSAID): Secondary | ICD-10-CM | POA: Insufficient documentation

## 2019-09-22 DIAGNOSIS — K219 Gastro-esophageal reflux disease without esophagitis: Secondary | ICD-10-CM | POA: Diagnosis not present

## 2019-09-22 DIAGNOSIS — K449 Diaphragmatic hernia without obstruction or gangrene: Secondary | ICD-10-CM | POA: Insufficient documentation

## 2019-09-22 DIAGNOSIS — N920 Excessive and frequent menstruation with regular cycle: Secondary | ICD-10-CM | POA: Insufficient documentation

## 2019-09-22 DIAGNOSIS — M419 Scoliosis, unspecified: Secondary | ICD-10-CM | POA: Insufficient documentation

## 2019-09-22 DIAGNOSIS — Z87891 Personal history of nicotine dependence: Secondary | ICD-10-CM | POA: Insufficient documentation

## 2019-09-22 DIAGNOSIS — Z91010 Allergy to peanuts: Secondary | ICD-10-CM | POA: Insufficient documentation

## 2019-09-22 DIAGNOSIS — Z880 Allergy status to penicillin: Secondary | ICD-10-CM | POA: Insufficient documentation

## 2019-09-22 DIAGNOSIS — D500 Iron deficiency anemia secondary to blood loss (chronic): Secondary | ICD-10-CM | POA: Diagnosis not present

## 2019-09-22 DIAGNOSIS — K591 Functional diarrhea: Secondary | ICD-10-CM | POA: Diagnosis not present

## 2019-09-22 DIAGNOSIS — F419 Anxiety disorder, unspecified: Secondary | ICD-10-CM | POA: Insufficient documentation

## 2019-09-22 DIAGNOSIS — Z79899 Other long term (current) drug therapy: Secondary | ICD-10-CM | POA: Insufficient documentation

## 2019-09-22 DIAGNOSIS — Z91018 Allergy to other foods: Secondary | ICD-10-CM | POA: Insufficient documentation

## 2019-09-22 DIAGNOSIS — Z6841 Body Mass Index (BMI) 40.0 and over, adult: Secondary | ICD-10-CM | POA: Insufficient documentation

## 2019-09-22 HISTORY — PX: ESOPHAGOGASTRODUODENOSCOPY (EGD) WITH PROPOFOL: SHX5813

## 2019-09-22 HISTORY — PX: COLONOSCOPY WITH PROPOFOL: SHX5780

## 2019-09-22 HISTORY — DX: Depression, unspecified: F32.A

## 2019-09-22 LAB — POCT PREGNANCY, URINE: Preg Test, Ur: NEGATIVE

## 2019-09-22 SURGERY — COLONOSCOPY WITH PROPOFOL
Anesthesia: General

## 2019-09-22 MED ORDER — LIDOCAINE HCL (CARDIAC) PF 100 MG/5ML IV SOSY
PREFILLED_SYRINGE | INTRAVENOUS | Status: DC | PRN
  Administered 2019-09-22: 50 mg via INTRAVENOUS

## 2019-09-22 MED ORDER — PROPOFOL 10 MG/ML IV BOLUS
INTRAVENOUS | Status: DC | PRN
  Administered 2019-09-22: 18 mg via INTRAVENOUS
  Administered 2019-09-22: 80 mg via INTRAVENOUS

## 2019-09-22 MED ORDER — PROPOFOL 500 MG/50ML IV EMUL
INTRAVENOUS | Status: AC
  Filled 2019-09-22: qty 50

## 2019-09-22 MED ORDER — PROPOFOL 500 MG/50ML IV EMUL
INTRAVENOUS | Status: DC | PRN
  Administered 2019-09-22: 130 ug/kg/min via INTRAVENOUS

## 2019-09-22 MED ORDER — MIDAZOLAM HCL 2 MG/2ML IJ SOLN
INTRAMUSCULAR | Status: AC
  Filled 2019-09-22: qty 2

## 2019-09-22 MED ORDER — MIDAZOLAM HCL 2 MG/2ML IJ SOLN
INTRAMUSCULAR | Status: DC | PRN
  Administered 2019-09-22: 2 mg via INTRAVENOUS

## 2019-09-22 MED ORDER — LIDOCAINE HCL (PF) 2 % IJ SOLN
INTRAMUSCULAR | Status: AC
  Filled 2019-09-22: qty 10

## 2019-09-22 MED ORDER — SODIUM CHLORIDE 0.9 % IV SOLN
INTRAVENOUS | Status: DC
  Administered 2019-09-22: 1000 mL via INTRAVENOUS

## 2019-09-22 NOTE — Transfer of Care (Signed)
Immediate Anesthesia Transfer of Care Note  Patient: TOREE SANTOR  Procedure(s) Performed: COLONOSCOPY WITH PROPOFOL (N/A ) ESOPHAGOGASTRODUODENOSCOPY (EGD) WITH PROPOFOL (N/A )  Patient Location: PACU and Endoscopy Unit  Anesthesia Type:General  Level of Consciousness: drowsy  Airway & Oxygen Therapy: Patient Spontanous Breathing  Post-op Assessment: Report given to RN and Post -op Vital signs reviewed and stable  Post vital signs: Reviewed and stable  Last Vitals:  Vitals Value Taken Time  BP 113/68 09/22/19 1203  Temp 36.4 C 09/22/19 1203  Pulse 100 09/22/19 1203  Resp 27 09/22/19 1203  SpO2 99 % 09/22/19 1203  Vitals shown include unvalidated device data.  Last Pain:  Vitals:   09/22/19 1203  TempSrc: Temporal  PainSc: 0-No pain         Complications: No apparent anesthesia complications

## 2019-09-22 NOTE — Op Note (Signed)
[facility] Gastroenterology Patient Name: Tammy Todd Procedure Date: 09/22/2019 11:32 AM MRN: [id number] Account #: [id number] Date of Birth: [birth year] Admit Type: Outpatient Age: 38 Room: [facility] ENDO ROOM 2 Gender: Female Note Status: Finalized Procedure:             Upper GI endoscopy Indications:           Epigastric abdominal pain, Iron deficiency anemia                         secondary to chronic blood loss Providers:             Benay Pike. Alice Reichert MD, MD Referring MD:          Gayland Curry MD, MD (Referring MD) Medicines:             Propofol per Anesthesia Complications:         No immediate complications. Procedure:             Pre-Anesthesia Assessment:                        - The risks and benefits of the procedure and the                         sedation options and risks were discussed with the                         patient. All questions were answered and informed                         consent was obtained.                        - Patient identification and proposed procedure were                         verified prior to the procedure by the nurse. The                         procedure was verified in the procedure room.                        - ASA Grade Assessment: III - A patient with severe                         systemic disease.                        - After reviewing the risks and benefits, the patient                         was deemed in satisfactory condition to undergo the                         procedure.                        After obtaining informed consent, the endoscope was                         passed under direct  vision. Throughout the procedure,                         the patient's blood pressure, pulse, and oxygen                         saturations were monitored continuously. The Endoscope                         was introduced through the mouth, and advanced to the                         third part of  duodenum. The upper GI endoscopy was                         accomplished without difficulty. The patient tolerated                         the procedure well. Findings:      The esophagus was normal.      A 2 cm hiatal hernia was present.      The examined duodenum was normal. Impression:            - Normal esophagus.                        - 2 cm hiatal hernia.                        - Normal examined duodenum.                        - No specimens collected. Recommendation:        - Proceed with colonoscopy Procedure Code(s):     --- Professional ---                        [phone], Esophagogastroduodenoscopy, flexible,                         transoral; diagnostic, including collection of                         specimen(s) by brushing or washing, when performed                         (separate procedure) Diagnosis Code(s):     --- Professional ---                        D50.0, Iron deficiency anemia secondary to blood loss                         (chronic)                        R10.13, Epigastric pain                        K44.9, Diaphragmatic hernia without obstruction or                         gangrene CPT copyright 2019 American  Medical Association. All rights reserved. The codes documented in this report are preliminary and upon coder review may  be revised to meet current compliance requirements. Efrain Sella MD, MD 09/22/2019 11:45:21 AM This report has been signed electronically. Number of Addenda: 0 Note Initiated On: 09/22/2019 11:32 AM Estimated Blood Loss:  Estimated blood loss: none.      [facility]

## 2019-09-22 NOTE — Anesthesia Post-op Follow-up Note (Signed)
Anesthesia QCDR form completed.        

## 2019-09-22 NOTE — H&P (Signed)
Outpatient short stay form Pre-procedure 09/22/2019 10:58 AM Tammy Todd, M.D.  Primary Physician: Gayland Curry, M.D.  Reason for visit:  Change in bowel habits, functional diarrhea  History of present illness: 38 y/o AAF presents for persistent diarrhea with IBS-D symptoms responsive to fiber supplementation, probiotics and dicyclomine. Infectious workup of diarrhea was negative. No rectal bleeding. Patient has low ferritin owed to menorrhagia symptoms. Has hx of stable GERD without alarm symptoms.     Current Facility-Administered Medications:  .  0.9 %  sodium chloride infusion, , Intravenous, Continuous, Sheneka Schrom, Benay Pike, MD  Medications Prior to Admission  Medication Sig Dispense Refill Last Dose  . Ascorbic Acid (VITAMIN C) 100 MG tablet Take by mouth.   Past Week at Unknown time  . benzonatate (TESSALON) 200 MG capsule Take 1 capsule (200 mg total) by mouth 3 (three) times daily as needed. 30 capsule 0 Past Week at Unknown time  . buPROPion (WELLBUTRIN SR) 150 MG 12 hr tablet Take by mouth.   Past Week at Unknown time  . cyclobenzaprine (FLEXERIL) 10 MG tablet cyclobenzaprine 10 mg tablet   Past Week at Unknown time  . dicyclomine (BENTYL) 10 MG capsule Take 10 mg by mouth 4 (four) times daily -  before meals and at bedtime.   Past Week at Unknown time  . ergocalciferol (VITAMIN D2) 1.25 MG (50000 UT) capsule Take 50,000 Units by mouth once a week.   Past Week at Unknown time  . ferrous fumarate (HEMOCYTE - 106 MG FE) 325 (106 FE) MG TABS tablet Take 1 tablet by mouth 2 (two) times daily.   Past Week at Unknown time  . gabapentin (NEURONTIN) 300 MG capsule Take 300 mg by mouth 3 (three) times daily.   Past Week at Unknown time  . hydrOXYzine (ATARAX/VISTARIL) 10 MG tablet hydroxyzine HCl 10 mg tablet   Past Week at Unknown time  . meloxicam (MOBIC) 15 MG tablet    Past Week at Unknown time  . pantoprazole (PROTONIX) 20 MG tablet Take 20 mg by mouth daily.   Past Week at  Unknown time  . sertraline (ZOLOFT) 50 MG tablet    Past Week at Unknown time  . terbinafine (LAMISIL) 250 MG tablet Take 1 tablet (250 mg total) by mouth daily. 45 tablet 0 Past Week at Unknown time  . traMADol (ULTRAM) 50 MG tablet tramadol 50 mg tablet   Past Week at Unknown time     Allergies  Allergen Reactions  . Cashew Nut Oil Other (See Comments)    Lips tingle and swell Lips tingle and swell   . Penicillins   . Pistachio Nut Extract Skin Test Nausea And Vomiting and Other (See Comments)    Lips tingle and swell Lips tingle and swell   . Peanut-Containing Drug Products     Lip tingling      Past Medical History:  Diagnosis Date  . Anemia   . Anxiety   . Depression   . GERD (gastroesophageal reflux disease)   . H. pylori infection   . Hiatal hernia   . Scoliosis     Review of systems:  Otherwise negative.    Physical Exam  Gen: Alert, oriented. Appears stated age.  HEENT: [place]/AT. PERRLA. Lungs: CTA, no wheezes. CV: RR nl S1, S2. Abd: soft, benign, no masses. BS+ Ext: No edema. Pulses 2+    Planned procedures: Proceed with colonoscopy with biopsy. The patient understands the nature of the planned procedure, indications, risks, alternatives and  potential complications including but not limited to bleeding, infection, perforation, damage to internal organs and possible oversedation/side effects from anesthesia. The patient agrees and gives consent to proceed.  Please refer to procedure notes for findings, recommendations and patient disposition/instructions.     Shyah Cadmus K. Alice Todd, M.D. Gastroenterology 09/22/2019  10:58 AM

## 2019-09-22 NOTE — Interval H&P Note (Signed)
History and Physical Interval Note:  09/22/2019 11:00 AM  Tammy Todd  has presented today for surgery, with the diagnosis of CHANGE IN BOWEL HABITS,  BIL.LOWER ABDOMINAL PAIN, GERD.  The various methods of treatment have been discussed with the patient and family. After consideration of risks, benefits and other options for treatment, the patient has consented to  Procedure(s): COLONOSCOPY WITH PROPOFOL (N/A) ESOPHAGOGASTRODUODENOSCOPY (EGD) WITH PROPOFOL (N/A) as a surgical intervention.  The patient's history has been reviewed, patient examined, no change in status, stable for surgery.  I have reviewed the patient's chart and labs.  Questions were answered to the patient's satisfaction.     [place], [place]

## 2019-09-22 NOTE — Anesthesia Preprocedure Evaluation (Addendum)
Anesthesia Evaluation  Patient identified by MRN, date of birth, ID band Patient awake    Reviewed: Allergy & Precautions, H&P , NPO status , Patient's Chart, lab work & pertinent test results, reviewed documented beta blocker date and time   History of Anesthesia Complications Negative for: history of anesthetic complications  Airway Mallampati: II  TM Distance: >3 FB Neck ROM: full    Dental  (+) Dental Advidsory Given, Teeth Intact   Pulmonary neg pulmonary ROS, former smoker,    Pulmonary exam normal        Cardiovascular Exercise Tolerance: Good negative cardio ROS Normal cardiovascular exam     Neuro/Psych PSYCHIATRIC DISORDERS Anxiety Depression negative neurological ROS     GI/Hepatic Neg liver ROS, hiatal hernia, GERD  ,  Endo/Other  neg diabetesMorbid obesity  Renal/GU negative Renal ROS  negative genitourinary   Musculoskeletal   Abdominal   Peds  Hematology negative hematology ROS (+)   Anesthesia Other Findings Past Medical History: No date: Anemia No date: Anxiety No date: Depression No date: GERD (gastroesophageal reflux disease) No date: H. pylori infection No date: Hiatal hernia No date: Scoliosis   Reproductive/Obstetrics negative OB ROS                            Anesthesia Physical Anesthesia Plan  ASA: III  Anesthesia Plan: General   Post-op Pain Management:    Induction: Intravenous  PONV Risk Score and Plan: 3 and Propofol infusion and TIVA  Airway Management Planned: Natural Airway and Nasal Cannula  Additional Equipment:   Intra-op Plan:   Post-operative Plan:   Informed Consent: I have reviewed the patients History and Physical, chart, labs and discussed the procedure including the risks, benefits and alternatives for the proposed anesthesia with the patient or authorized representative who has indicated his/her understanding and acceptance.      Dental Advisory Given  Plan Discussed with: Anesthesiologist, CRNA and Surgeon  Anesthesia Plan Comments:         Anesthesia Quick Evaluation

## 2019-09-22 NOTE — Op Note (Signed)
[facility] Gastroenterology Patient Name: Tammy Todd Procedure Date: 09/22/2019 11:31 AM MRN: [id number] Account #: [id number] Date of Birth: 1981-09-11 Admit Type: Outpatient Age: 38 Room: [facility] ENDO ROOM 2 Gender: Female Note Status: Finalized Procedure:             Colonoscopy Indications:           Functional diarrhea Providers:             Benay Pike. Rashawn Rolon MD, MD Medicines:             Propofol per Anesthesia Complications:         No immediate complications. Procedure:             Pre-Anesthesia Assessment:                        - The risks and benefits of the procedure and the                         sedation options and risks were discussed with the                         patient. All questions were answered and informed                         consent was obtained.                        - Patient identification and proposed procedure were                         verified prior to the procedure by the nurse. The                         procedure was verified in the procedure room.                        - ASA Grade Assessment: III - A patient with severe                         systemic disease.                        - After reviewing the risks and benefits, the patient                         was deemed in satisfactory condition to undergo the                         procedure.                        After obtaining informed consent, the colonoscope was                         passed under direct vision. Throughout the procedure,                         the patient's blood pressure, pulse, and oxygen  saturations were monitored continuously. The                         Colonoscope was introduced through the anus and                         advanced to the the terminal ileum, with                         identification of the appendiceal orifice and IC                         valve. The colonoscopy was performed without                         difficulty. The patient tolerated the procedure well.                         The quality of the bowel preparation was excellent. Findings:      The perianal and digital rectal examinations were normal. Pertinent       negatives include normal sphincter tone and no palpable rectal lesions.      The terminal ileum appeared normal. Biopsies were taken with a cold       forceps for histology.      Normal mucosa was found in the entire colon. Biopsies for histology were       taken with a cold forceps from the right colon, left colon and rectum       for evaluation of microscopic colitis.      Non-bleeding internal hemorrhoids were found during retroflexion. The       hemorrhoids were Grade I (internal hemorrhoids that do not prolapse).      The exam was otherwise without abnormality. Impression:            - The examined portion of the ileum was normal.                         Biopsied.                        - Normal mucosa in the entire examined colon. Biopsied.                        - Non-bleeding internal hemorrhoids.                        - The examination was otherwise normal. Recommendation:        - Patient has a contact number available for                         emergencies. The signs and symptoms of potential                         delayed complications were discussed with the patient.                         Return to normal activities tomorrow. Written  discharge instructions were provided to the patient.                        - Resume previous diet.                        - Continue present medications.                        - Await pathology results.                        - Return to physician assistant in 3 months.                        - The findings and recommendations were discussed with                         the patient. Procedure Code(s):     --- Professional ---                        [phone], Colonoscopy, flexible;  with biopsy, single or                         multiple Diagnosis Code(s):     --- Professional ---                        K59.1, Functional diarrhea                        K64.0, First degree hemorrhoids CPT copyright 2019 American Medical Association. All rights reserved. The codes documented in this report are preliminary and upon coder review may  be revised to meet current compliance requirements. Efrain Sella MD, MD 09/22/2019 12:03:45 PM This report has been signed electronically. Number of Addenda: 0 Note Initiated On: 09/22/2019 11:31 AM Scope Withdrawal Time: 0 hours 7 minutes 37 seconds  Total Procedure Duration: 0 hours 10 minutes 21 seconds  Estimated Blood Loss:  Estimated blood loss: none. Estimated blood loss: none.      [facility]

## 2019-09-22 NOTE — Anesthesia Postprocedure Evaluation (Signed)
Anesthesia Post Note  Patient: Tammy Todd  Procedure(s) Performed: COLONOSCOPY WITH PROPOFOL (N/A ) ESOPHAGOGASTRODUODENOSCOPY (EGD) WITH PROPOFOL (N/A )  Patient location during evaluation: PACU Anesthesia Type: General Level of consciousness: awake and alert Pain management: pain level controlled Vital Signs Assessment: post-procedure vital signs reviewed and stable Respiratory status: spontaneous breathing, nonlabored ventilation, respiratory function stable and patient connected to nasal cannula oxygen Cardiovascular status: blood pressure returned to baseline and stable Postop Assessment: no apparent nausea or vomiting Anesthetic complications: no     Last Vitals:  Vitals:   09/22/19 1213 09/22/19 1223  BP: 110/76 130/90  Pulse: 94 87  Resp: 17 12  Temp:    SpO2: 99% 99%    Last Pain:  Vitals:   09/22/19 1223  TempSrc:   PainSc: 0-No pain                 Martha Clan

## 2019-09-23 ENCOUNTER — Encounter: Payer: Self-pay | Admitting: Internal Medicine

## 2019-09-23 LAB — SURGICAL PATHOLOGY

## 2019-10-07 ENCOUNTER — Encounter: Payer: Self-pay | Admitting: Podiatry

## 2019-10-07 ENCOUNTER — Ambulatory Visit: Payer: 59 | Admitting: Podiatry

## 2019-10-07 ENCOUNTER — Other Ambulatory Visit: Payer: Self-pay

## 2019-10-07 DIAGNOSIS — B353 Tinea pedis: Secondary | ICD-10-CM

## 2019-10-07 DIAGNOSIS — R234 Changes in skin texture: Secondary | ICD-10-CM

## 2019-10-16 NOTE — Progress Notes (Signed)
   HPI: 38 y.o. female presenting today for follow up evaluation of tinea pedis and heel fissures bilaterally. She states she is doing well and has improved significantly. She has been using the Revitaderm and taking the Lamisil as directed. She denies any current worsening factors. Patient is here for further evaluation and treatment.   Past Medical History:  Diagnosis Date  . Anemia   . Anxiety   . Depression   . GERD (gastroesophageal reflux disease)   . H. pylori infection   . Hiatal hernia   . Scoliosis      Physical Exam: General: The patient is alert and oriented x3 in no acute distress.  Dermatology: Pruritus noted to the bilateral heels of the with hyperkeratosis. Skin is warm, dry and supple bilateral lower extremities. Negative for open lesions or macerations.  Vascular: Palpable pedal pulses bilaterally. No edema or erythema noted. Capillary refill within normal limits.  Neurological: Epicritic and protective threshold grossly intact bilaterally.   Musculoskeletal Exam: Range of motion within normal limits to all pedal and ankle joints bilateral. Muscle strength 5/5 in all groups bilateral.   Assessment: 1. Tinea pedis bilateral - improved  2. Heel fissures bilateral - resolved    Plan of Care:  1. Patient evaluated. 2. Continue using Revitaderm Urea 40% daily.  3. Finish taking oral Lamisil as prescribed.  4. Continue to avoid going barefoot.  5. Return to clinic as needed.      Edrick Kins, DPM [facility]  Dr. Edrick Kins, DPM    [address], [place] [address]                Office [phone]  Fax [phone]

## 2019-10-24 HISTORY — PX: HIATAL HERNIA REPAIR: SHX195

## 2019-10-24 HISTORY — PX: GASTROPLASTY DUODENAL SWITCH: SHX1699

## 2019-11-15 ENCOUNTER — Other Ambulatory Visit: Payer: Self-pay

## 2019-11-15 ENCOUNTER — Emergency Department: Payer: 59

## 2019-11-15 ENCOUNTER — Encounter: Payer: Self-pay | Admitting: Emergency Medicine

## 2019-11-15 ENCOUNTER — Emergency Department
Admission: EM | Admit: 2019-11-15 | Discharge: 2019-11-15 | Disposition: A | Discharge status: Home / Self Care | Payer: 59 | Attending: Emergency Medicine | Admitting: Emergency Medicine

## 2019-11-15 DIAGNOSIS — Z87891 Personal history of nicotine dependence: Secondary | ICD-10-CM | POA: Diagnosis not present

## 2019-11-15 DIAGNOSIS — E860 Dehydration: Secondary | ICD-10-CM | POA: Insufficient documentation

## 2019-11-15 DIAGNOSIS — Z91010 Allergy to peanuts: Secondary | ICD-10-CM | POA: Insufficient documentation

## 2019-11-15 DIAGNOSIS — R112 Nausea with vomiting, unspecified: Secondary | ICD-10-CM | POA: Diagnosis not present

## 2019-11-15 LAB — URINALYSIS, COMPLETE (UACMP) WITH MICROSCOPIC
Bacteria, UA: NONE SEEN
Bilirubin Urine: NEGATIVE
Glucose, UA: NEGATIVE mg/dL
Hgb urine dipstick: NEGATIVE
Ketones, ur: 80 mg/dL — AB
Leukocytes,Ua: NEGATIVE
Nitrite: NEGATIVE
Protein, ur: 100 mg/dL — AB
Specific Gravity, Urine: 1.029 (ref 1.005–1.030)
pH: 6 (ref 5.0–8.0)

## 2019-11-15 LAB — CBC
HCT: 42.7 % (ref 36.0–46.0)
Hemoglobin: 13.2 g/dL (ref 12.0–15.0)
MCH: 23.8 pg — ABNORMAL LOW (ref 26.0–34.0)
MCHC: 30.9 g/dL (ref 30.0–36.0)
MCV: 77.1 fL — ABNORMAL LOW (ref 80.0–100.0)
Platelets: 294 10*3/uL (ref 150–400)
RBC: 5.54 MIL/uL — ABNORMAL HIGH (ref 3.87–5.11)
RDW: 18.3 % — ABNORMAL HIGH (ref 11.5–15.5)
WBC: 8.7 10*3/uL (ref 4.0–10.5)
nRBC: 0 % (ref 0.0–0.2)

## 2019-11-15 LAB — COMPREHENSIVE METABOLIC PANEL
ALT: 15 U/L (ref 0–44)
AST: 20 U/L (ref 15–41)
Albumin: 4.1 g/dL (ref 3.5–5.0)
Alkaline Phosphatase: 66 U/L (ref 38–126)
Anion gap: 16 — ABNORMAL HIGH (ref 5–15)
BUN: 7 mg/dL (ref 6–20)
CO2: 17 mmol/L — ABNORMAL LOW (ref 22–32)
Calcium: 9.5 mg/dL (ref 8.9–10.3)
Chloride: 105 mmol/L (ref 98–111)
Creatinine, Ser: 0.69 mg/dL (ref 0.44–1.00)
GFR calc Af Amer: >60 mL/min (ref >60)
GFR calc non Af Amer: >60 mL/min (ref >60)
Glucose, Bld: 101 mg/dL — ABNORMAL HIGH (ref 70–99)
Potassium: 3.1 mmol/L — ABNORMAL LOW (ref 3.5–5.1)
Sodium: 138 mmol/L (ref 135–145)
Total Bilirubin: 0.9 mg/dL (ref 0.3–1.2)
Total Protein: 8.1 g/dL (ref 6.5–8.1)

## 2019-11-15 MED ORDER — LACTATED RINGERS IV BOLUS
1000.0000 mL | Freq: Once | INTRAVENOUS | Status: AC
  Administered 2019-11-15: 1000 mL via INTRAVENOUS

## 2019-11-15 MED ORDER — PROMETHAZINE HCL 25 MG PO TABS: 25.0000 mg | ORAL_TABLET | ORAL | 1 refills | Status: DC | PRN

## 2019-11-15 MED ORDER — PROMETHAZINE HCL 25 MG/ML IJ SOLN
25.0000 mg | Freq: Once | INTRAMUSCULAR | Status: AC
  Administered 2019-11-15: 25 mg via INTRAVENOUS
  Filled 2019-11-15: qty 1

## 2019-11-15 MED ORDER — PANTOPRAZOLE SODIUM 40 MG IV SOLR
40.0000 mg | Freq: Once | INTRAVENOUS | Status: AC
  Administered 2019-11-15: 40 mg via INTRAVENOUS
  Filled 2019-11-15: qty 40

## 2019-11-15 NOTE — ED Notes (Signed)
Pt c/o right hand feeling numb. No other focal sx.  Speech clear. No weakness.  Dr Jimmye Norman aware.

## 2019-11-15 NOTE — ED Provider Notes (Signed)
[facility] Emergency Department Provider Note       Time seen: ----------------------------------------- 11:33 AM on 11/15/2019 -----------------------------------------   I have reviewed the triage vital signs and the nursing notes.  HISTORY   Chief Complaint No chief complaint on file.    HPI Tammy Todd is a 38 y.o. female with a history of anxiety, anemia, depression, GERD, H. pylori, hiatal hernia who presents to the ED for poor p.o. intake.  Patient reports she had bariatric surgery at the beginning of December and has not been able to keep things down since.  She has had some left-sided abdominal pain and constipation.  She called her bariatric surgeon who requested she come to the ED for further evaluation.  Past Medical History:  Diagnosis Date  . Anemia   . Anxiety   . Depression   . GERD (gastroesophageal reflux disease)   . H. pylori infection   . Hiatal hernia   . Scoliosis     Patient Active Problem List   Diagnosis Date Noted  . Vitamin D deficiency 12/13/2018  . Abnormal weight gain 11/03/2018  . Chronic midline low back pain without sciatica 11/03/2018  . Class 3 severe obesity due to excess calories with serious comorbidity and body mass index (BMI) of 45.0 to 49.9 in adult ([place]) 11/03/2018  . Encounter for weight loss counseling 11/03/2018  . Generalized anxiety disorder 11/03/2018  . Iron deficiency 04/03/2015  . Dysmenorrhea 08/03/2014  . Female fertility problem 08/03/2014    Past Surgical History:  Procedure Laterality Date  . BARIATRIC SURGERY    . COLONOSCOPY WITH PROPOFOL N/A 09/22/2019   Procedure: COLONOSCOPY WITH PROPOFOL;  Surgeon: Toledo, Benay Pike, MD;  Location: [facility] ENDOSCOPY;  Service: Endoscopy;  Laterality: N/A;  . ESOPHAGOGASTRODUODENOSCOPY (EGD) WITH PROPOFOL N/A 09/22/2019   Procedure: ESOPHAGOGASTRODUODENOSCOPY (EGD) WITH PROPOFOL;  Surgeon: Toledo, Benay Pike, MD;  Location: [facility] ENDOSCOPY;   Service: Endoscopy;  Laterality: N/A;    Allergies Cashew nut oil, Penicillins, Pistachio nut extract skin test, and Peanut-containing drug products  Social History Social History   Tobacco Use  . Smoking status: Former Smoker    Years: 9.00  . Smokeless tobacco: Never Used  Substance Use Topics  . Alcohol use: Yes    Comment: social  . Drug use: No    Review of Systems Constitutional: Negative for fever. Cardiovascular: Negative for chest pain. Respiratory: Negative for shortness of breath. Gastrointestinal: Positive for abdominal pain, constipation Musculoskeletal: Negative for back pain. Skin: Negative for rash. Neurological: Negative for headaches, focal weakness or numbness.  All systems negative/normal/unremarkable except as stated in the HPI  ____________________________________________   PHYSICAL EXAM:  VITAL SIGNS: ED Triage Vitals  Enc Vitals Group     BP 11/15/19 1047 129/84     Pulse Rate 11/15/19 1047 (!) 110     Resp 11/15/19 1047 18     Temp 11/15/19 1047 99.1 F (37.3 C)     Temp Source 11/15/19 1047 Oral     SpO2 11/15/19 1047 100 %     Weight 11/15/19 1028 238 lb (108 kg)     Height 11/15/19 1028 5\' 3"  (1.6 m)     Head Circumference --      Peak Flow --      Pain Score 11/15/19 1028 4     Pain Loc --      Pain Edu? --      Excl. in [place]? --     Constitutional: Alert and oriented.  Well appearing and in no distress. Eyes: Conjunctivae are normal. Normal extraocular movements. ENT      Head: Normocephalic and atraumatic.      Nose: No congestion/rhinnorhea.      Mouth/Throat: Mucous membranes are moist.      Neck: No stridor. Cardiovascular: Normal rate, regular rhythm. No murmurs, rubs, or gallops. Respiratory: Normal respiratory effort without tachypnea nor retractions. Breath sounds are clear and equal bilaterally. No wheezes/rales/rhonchi. Gastrointestinal: Soft and nontender. Normal bowel sounds.  Operative incision sites appear clean  dry and intact Musculoskeletal: Nontender with normal range of motion in extremities. No lower extremity tenderness nor edema. Neurologic:  Normal speech and language. No gross focal neurologic deficits are appreciated.  Skin:  Skin is warm, dry and intact. No rash noted. Psychiatric: Mood and affect are normal. Speech and behavior are normal.  ____________________________________________  ED COURSE:  As part of my medical decision making, I reviewed the following data within the [place] History obtained from family if available, nursing notes, old chart and ekg, as well as notes from prior ED visits. Patient presented for abdominal pain and poor p.o. intake, we will assess with labs and imaging as indicated at this time.   Procedures  Tammy Todd was evaluated in Emergency Department on 11/15/2019 for the symptoms described in the history of present illness. She was evaluated in the context of the global COVID-19 pandemic, which necessitated consideration that the patient might be at risk for infection with the SARS-CoV-2 virus that causes COVID-19. Institutional protocols and algorithms that pertain to the evaluation of patients at risk for COVID-19 are in a state of rapid change based on information released by regulatory bodies including the CDC and federal and state organizations. These policies and algorithms were followed during the patient's care in the ED.  ____________________________________________   LABS (pertinent positives/negatives)  Labs Reviewed  CBC - Abnormal; Notable for the following components:      Result Value   RBC 5.54 (*)    MCV 77.1 (*)    MCH 23.8 (*)    RDW 18.3 (*)    All other components within normal limits  COMPREHENSIVE METABOLIC PANEL - Abnormal; Notable for the following components:   Potassium 3.1 (*)    CO2 17 (*)    Glucose, Bld 101 (*)    Anion gap 16 (*)    All other components within normal limits  URINALYSIS,  COMPLETE (UACMP) WITH MICROSCOPIC - Abnormal; Notable for the following components:   Color, Urine YELLOW (*)    APPearance CLEAR (*)    Ketones, ur 80 (*)    Protein, ur 100 (*)    All other components within normal limits    RADIOLOGY Images were viewed by me  Abdomen 2 view IMPRESSION:  Negative.  ____________________________________________   DIFFERENTIAL DIAGNOSIS   Dehydration, electrolyte abnormality, obstruction unlikely, gastritis, peptic ulcer disease, postoperative complication  FINAL ASSESSMENT AND PLAN  Dehydration, vomiting   Plan: The patient had presented for dehydration and vomiting likely related to gastric bypass surgery performed earlier this month.  She was given IV fluids and antiemetics.  Patient's labs did indicate dehydration. Patient's imaging did not reveal any acute process.  She was given 2 L of fluid and feels better.  She is cleared for outpatient follow-up.   Laurence Aly, MD    Note: This note was generated in part or whole with voice recognition software. Voice recognition is usually quite accurate but there are  transcription errors that can and very often do occur. I apologize for any typographical errors that were not detected and corrected.     Earleen Newport, MD 11/15/19 1357

## 2019-11-15 NOTE — ED Triage Notes (Signed)
Pt reports had bariatric surgery at the beginning of December and has not been able to keep anything down or stay hydrated since then. Pt does reports some lower left sided abd pain and constipation. Pt reports called her bariatric surgeon who advised her to come to the ED.

## 2019-11-15 NOTE — ED Notes (Addendum)
Pt now c/o numbness moving from hand up right arm and feeling numb in right foot. dr Jimmye Norman notified.

## 2020-02-27 ENCOUNTER — Other Ambulatory Visit: Payer: Self-pay | Admitting: Medical Oncology

## 2020-02-27 DIAGNOSIS — R946 Abnormal results of thyroid function studies: Secondary | ICD-10-CM

## 2020-03-30 ENCOUNTER — Ambulatory Visit
Admission: RE | Admit: 2020-03-30 | Discharge: 2020-03-30 | Disposition: A | Discharge status: Home / Self Care | Payer: 59 | Source: Ambulatory Visit | Attending: Medical Oncology | Admitting: Medical Oncology

## 2020-03-30 ENCOUNTER — Other Ambulatory Visit: Payer: Self-pay

## 2020-03-30 DIAGNOSIS — R946 Abnormal results of thyroid function studies: Secondary | ICD-10-CM | POA: Insufficient documentation

## 2020-05-31 ENCOUNTER — Encounter: Payer: Self-pay | Admitting: Oncology

## 2020-05-31 NOTE — Progress Notes (Signed)
Patient contacted for new patient visit. Notified patient that this will be an initial consult and she will not be getting IV iron tomorrow. Pt voiced understanding.

## 2020-06-01 ENCOUNTER — Other Ambulatory Visit: Payer: Self-pay

## 2020-06-01 ENCOUNTER — Inpatient Hospital Stay: Payer: 59 | Attending: Oncology | Admitting: Oncology

## 2020-06-01 ENCOUNTER — Inpatient Hospital Stay: Payer: 59

## 2020-06-01 VITALS — BP 113/80 | HR 82 | Temp 97.3°F | Resp 20 | Ht 63.0 in | Wt 237.0 lb

## 2020-06-01 DIAGNOSIS — F419 Anxiety disorder, unspecified: Secondary | ICD-10-CM | POA: Insufficient documentation

## 2020-06-01 DIAGNOSIS — F329 Major depressive disorder, single episode, unspecified: Secondary | ICD-10-CM | POA: Insufficient documentation

## 2020-06-01 DIAGNOSIS — R0602 Shortness of breath: Secondary | ICD-10-CM | POA: Insufficient documentation

## 2020-06-01 DIAGNOSIS — K219 Gastro-esophageal reflux disease without esophagitis: Secondary | ICD-10-CM | POA: Insufficient documentation

## 2020-06-01 DIAGNOSIS — R5383 Other fatigue: Secondary | ICD-10-CM | POA: Insufficient documentation

## 2020-06-01 DIAGNOSIS — Z9884 Bariatric surgery status: Secondary | ICD-10-CM | POA: Diagnosis not present

## 2020-06-01 DIAGNOSIS — Z79899 Other long term (current) drug therapy: Secondary | ICD-10-CM | POA: Diagnosis not present

## 2020-06-01 DIAGNOSIS — Z791 Long term (current) use of non-steroidal anti-inflammatories (NSAID): Secondary | ICD-10-CM | POA: Diagnosis not present

## 2020-06-01 DIAGNOSIS — D509 Iron deficiency anemia, unspecified: Secondary | ICD-10-CM | POA: Insufficient documentation

## 2020-06-01 DIAGNOSIS — Z87891 Personal history of nicotine dependence: Secondary | ICD-10-CM | POA: Diagnosis not present

## 2020-06-01 DIAGNOSIS — N920 Excessive and frequent menstruation with regular cycle: Secondary | ICD-10-CM | POA: Insufficient documentation

## 2020-06-01 DIAGNOSIS — E611 Iron deficiency: Secondary | ICD-10-CM

## 2020-06-01 NOTE — Progress Notes (Signed)
Hematology/Oncology Consult note [facility] [phone] [phone]   Patient Care Team: Gayland Curry, MD as PCP - General (Family Medicine)  REFERRING PROVIDER: Bonner Puna, MD CHIEF COMPLAINTS/REASON FOR VISIT:  Evaluation of iron deficiency anemia  HISTORY OF PRESENTING ILLNESS:  Tammy Todd is a  39 y.o.  female with PMH listed below was seen in consultation at the request of Bonner Puna, MD   for evaluation of iron deficiency anemia.   Reviewed patient's recent labs  05/07/2020 [facility] CBC showed hemoglobin 10.7, MCV 73, RDW 16.5, WBC 6.9, normal differential platelet count 427,000.  Creatinine 0.88 sodium 141, potassium 4.2, calcium 9.2, albumin 4.3, bilirubin 1.3, copper 640 MCG/l, folate 14.3, vitamin D 25 hydroxy 38.3, vitamin K one 0.05 vitamin B12 more than 2000, zinc 85, ferritin 5, iron 37 Reviewed patient's previous labs ordered by primary care physician's office, anemia is chronic onset  No aggravating or improving factors.  Associated signs and symptoms: Patient reports fatigue.  SOB with exertion.  Denies weight loss, easy bruising, hematochezia, hemoptysis, hematuria. Context:  History of iron deficiency: Longstanding history of iron deficiency.  Patient takes oral iron supplementation ferrous sulfate 325 mg twice daily. Rectal bleeding: Denies Menstrual bleeding/ Vaginal bleeding : Heavy menstrual bleeding Hematemesis or hemoptysis : denies Blood in urine : denies  History of bariatric surgery.    Review of Systems  Constitutional: Positive for fatigue. Negative for appetite change, chills and fever.  HENT:   Negative for hearing loss and voice change.   Eyes: Negative for eye problems.  Respiratory: Negative for chest tightness and cough.   Cardiovascular: Negative for chest pain.  Gastrointestinal: Negative for abdominal distention, abdominal pain and blood in stool.  Endocrine: Negative for hot  flashes.  Genitourinary: Positive for menstrual problem. Negative for difficulty urinating and frequency.   Musculoskeletal: Negative for arthralgias.  Skin: Negative for itching and rash.  Neurological: Negative for extremity weakness.  Hematological: Negative for adenopathy.  Psychiatric/Behavioral: Negative for confusion.    MEDICAL HISTORY:  Past Medical History:  Diagnosis Date  . Anemia   . Anxiety   . Depression   . GERD (gastroesophageal reflux disease)   . H. pylori infection   . Hiatal hernia   . Scoliosis     SURGICAL HISTORY: Past Surgical History:  Procedure Laterality Date  . BARIATRIC SURGERY    . COLONOSCOPY WITH PROPOFOL N/A 09/22/2019   Procedure: COLONOSCOPY WITH PROPOFOL;  Surgeon: Toledo, Benay Pike, MD;  Location: [facility] ENDOSCOPY;  Service: Endoscopy;  Laterality: N/A;  . ESOPHAGOGASTRODUODENOSCOPY (EGD) WITH PROPOFOL N/A 09/22/2019   Procedure: ESOPHAGOGASTRODUODENOSCOPY (EGD) WITH PROPOFOL;  Surgeon: Toledo, Benay Pike, MD;  Location: [facility] ENDOSCOPY;  Service: Endoscopy;  Laterality: N/A;    SOCIAL HISTORY: Social History   Socioeconomic History  . Marital status: Married    Spouse name: Not on file  . Number of children: Not on file  . Years of education: Not on file  . Highest education level: Not on file  Occupational History  . Not on file  Tobacco Use  . Smoking status: Former Smoker    Years: 9.00    Quit date: 12/2009    Years since quitting: 10.4  . Smokeless tobacco: Never Used  Vaping Use  . Vaping Use: Never used  Substance and Sexual Activity  . Alcohol use: Yes    Comment: social  . Drug use: No  . Sexual activity: Yes  Other Topics Concern  . Not on  file  Social History Narrative  . Not on file   Social Determinants of Health   Financial Resource Strain:   . Difficulty of Paying Living Expenses:   Food Insecurity:   . Worried About Charity fundraiser in the Last Year:   . Arboriculturist in the Last Year:     Transportation Needs:   . Film/video editor (Medical):   Marland Kitchen Lack of Transportation (Non-Medical):   Physical Activity:   . Days of Exercise per Week:   . Minutes of Exercise per Session:   Stress:   . Feeling of Stress :   Social Connections:   . Frequency of Communication with Friends and Family:   . Frequency of Social Gatherings with Friends and Family:   . Attends Religious Services:   . Active Member of Clubs or Organizations:   . Attends Archivist Meetings:   Marland Kitchen Marital Status:   Intimate Partner Violence:   . Fear of Current or Ex-Partner:   . Emotionally Abused:   Marland Kitchen Physically Abused:   . Sexually Abused:     FAMILY HISTORY: Family History  Problem Relation Age of Onset  . Diabetes Mother   . Hyperlipidemia Mother   . Hypertension Mother   . Alcohol abuse Father   . Hypertension Father   . Obesity Father   . Hyperlipidemia Father   . Diabetes Father   . Depression Sister   . Anxiety disorder Sister   . Diabetes Sister   . Diabetes Brother   . Hyperlipidemia Brother   . Hypertension Brother   . Obesity Paternal Grandmother   . Breast cancer Paternal Aunt   . Stomach cancer Paternal Uncle     ALLERGIES:  is allergic to cashew nut oil, penicillins, and pistachio nut extract skin test.  MEDICATIONS:  Current Outpatient Medications  Medication Sig Dispense Refill  . Ascorbic Acid (VITAMIN C) 100 MG tablet Take by mouth.    . benzonatate (TESSALON) 200 MG capsule Take 1 capsule (200 mg total) by mouth 3 (three) times daily as needed. 30 capsule 0  . Calcium Carbonate (CALCIUM 500 PO) Take 1 tablet by mouth 4 (four) times daily.    . ergocalciferol (VITAMIN D2) 1.25 MG (50000 UT) capsule Take 50,000 Units by mouth once a week.    . ferrous fumarate (HEMOCYTE - 106 MG FE) 325 (106 FE) MG TABS tablet Take 1 tablet by mouth 2 (two) times daily.    . Multiple Vitamins-Minerals (MULTIVITAMIN ADULT EXTRA C) CHEW Chew 1 tablet by mouth daily.    Marland Kitchen  buPROPion (WELLBUTRIN SR) 150 MG 12 hr tablet Take by mouth. (Patient not taking: Reported on 05/31/2020)    . cyclobenzaprine (FLEXERIL) 10 MG tablet cyclobenzaprine 10 mg tablet (Patient not taking: Reported on 05/31/2020)    . dicyclomine (BENTYL) 10 MG capsule Take 10 mg by mouth 4 (four) times daily -  before meals and at bedtime. (Patient not taking: Reported on 05/31/2020)    . gabapentin (NEURONTIN) 300 MG capsule Take 300 mg by mouth 3 (three) times daily. (Patient not taking: Reported on 05/31/2020)    . hydrOXYzine (ATARAX/VISTARIL) 10 MG tablet hydroxyzine HCl 10 mg tablet (Patient not taking: Reported on 05/31/2020)    . meloxicam (MOBIC) 15 MG tablet  (Patient not taking: Reported on 05/31/2020)    . pantoprazole (PROTONIX) 20 MG tablet Take 20 mg by mouth daily. (Patient not taking: Reported on 05/31/2020)    . promethazine (PHENERGAN) 25  MG tablet Take 1 tablet (25 mg total) by mouth every 4 (four) hours as needed for nausea or vomiting. (Patient not taking: Reported on 05/31/2020) 30 tablet 1  . sertraline (ZOLOFT) 50 MG tablet  (Patient not taking: Reported on 05/31/2020)    . terbinafine (LAMISIL) 250 MG tablet Take 1 tablet (250 mg total) by mouth daily. (Patient not taking: Reported on 05/31/2020) 45 tablet 0  . traMADol (ULTRAM) 50 MG tablet tramadol 50 mg tablet (Patient not taking: Reported on 05/31/2020)     No current facility-administered medications for this visit.     PHYSICAL EXAMINATION: ECOG PERFORMANCE STATUS: 0 - Asymptomatic Vitals:   06/01/20 0922  BP: 113/80  Pulse: 82  Resp: 20  Temp: (!) 97.3 F (36.3 C)   Filed Weights   06/01/20 0921  Weight: 237 lb (107.5 kg)    Physical Exam Constitutional:      General: She is not in acute distress. HENT:     Head: Normocephalic and atraumatic.  Eyes:     General: No scleral icterus. Cardiovascular:     Rate and Rhythm: Normal rate and regular rhythm.     Heart sounds: Normal heart sounds.  Pulmonary:      Effort: Pulmonary effort is normal. No respiratory distress.     Breath sounds: No wheezing.  Abdominal:     General: Bowel sounds are normal. There is no distension.     Palpations: Abdomen is soft.  Musculoskeletal:        General: No deformity. Normal range of motion.     Cervical back: Normal range of motion and neck supple.  Skin:    General: Skin is warm and dry.     Findings: No erythema or rash.  Neurological:     Mental Status: She is alert and oriented to person, place, and time. Mental status is at baseline.     Cranial Nerves: No cranial nerve deficit.     Coordination: Coordination normal.  Psychiatric:        Mood and Affect: Mood normal.       CMP Latest Ref Rng & Units 11/15/2019  Glucose 70 - 99 mg/dL 101(H)  BUN 6 - 20 mg/dL 7  Creatinine 0.44 - 1.00 mg/dL 0.69  Sodium 135 - 145 mmol/L 138  Potassium 3.5 - 5.1 mmol/L 3.1(L)  Chloride 98 - 111 mmol/L 105  CO2 22 - 32 mmol/L 17(L)  Calcium 8.9 - 10.3 mg/dL 9.5  Total Protein 6.5 - 8.1 g/dL 8.1  Total Bilirubin 0.3 - 1.2 mg/dL 0.9  Alkaline Phos 38 - 126 U/L 66  AST 15 - 41 U/L 20  ALT 0 - 44 U/L 15   CBC Latest Ref Rng & Units 11/15/2019  WBC 4.0 - 10.5 K/uL 8.7  Hemoglobin 12.0 - 15.0 g/dL 13.2  Hematocrit 36 - 46 % 42.7  Platelets 150 - 400 K/uL 294     LABORATORY DATA:  I have reviewed the data as listed Lab Results  Component Value Date   WBC 8.7 11/15/2019   HGB 13.2 11/15/2019   HCT 42.7 11/15/2019   MCV 77.1 (L) 11/15/2019   PLT 294 11/15/2019   Recent Labs    11/15/19 1048  NA 138  K 3.1*  CL 105  CO2 17*  GLUCOSE 101*  BUN 7  CREATININE 0.69  CALCIUM 9.5  GFRNONAA >60  GFRAA >60  PROT 8.1  ALBUMIN 4.1  AST 20  ALT 15  ALKPHOS 66  BILITOT 0.9  Iron/TIBC/Ferritin/ %Sat No results found for: IRON, TIBC, FERRITIN, IRONPCTSAT   RADIOGRAPHIC STUDIES: I have personally reviewed the radiological images as listed and agreed with the findings in the report. No results  found.     ASSESSMENT & PLAN:  1. Iron deficiency   2. Menorrhagia with regular cycle    Labs are reviewed and discussed with patient. Consistent with iron deficiency anemia. In the context of gastric bypass, possible malabsorption, failed oral iron supplementation, I discussed with patient about option of IV iron. Plan IV iron with Venofer 200mg  weekly x 4 doses. Allergy reactions/infusion reaction including anaphylactic reaction discussed with patient. Other side effects include but not limited to high blood pressure, skin rash, weight gain, leg swelling, etc. Patient voices understanding and willing to proceed.  Menorrhagia, recommend patient to see gynecology for further evaluation. History of bariatric surgeries, continue multi vitamins.  Orders Placed This Encounter  Procedures  . CBC with Differential/Platelet    Standing Status:   Future    Standing Expiration Date:   06/01/2021  . Ferritin    Standing Status:   Future    Standing Expiration Date:   06/01/2021  . Iron and TIBC    Standing Status:   Future    Standing Expiration Date:   06/01/2021  . Comprehensive metabolic panel    Standing Status:   Future    Standing Expiration Date:   06/01/2021    All questions were answered. The patient knows to call the clinic with any problems questions or concerns.  Cc Bonner Puna, MD  Return of visit: 8 weeks Thank you for this kind referral and the opportunity to participate in the care of this patient. A copy of today's note is routed to referring provider   Earlie Server, MD, PhD Hematology Oncology [place] [facility] 06/01/2020

## 2020-06-05 ENCOUNTER — Other Ambulatory Visit: Payer: Self-pay | Admitting: Oncology

## 2020-06-06 ENCOUNTER — Other Ambulatory Visit: Payer: Self-pay

## 2020-06-06 ENCOUNTER — Inpatient Hospital Stay: Payer: 59

## 2020-06-06 VITALS — BP 108/70 | HR 60 | Temp 98.0°F | Resp 18

## 2020-06-06 DIAGNOSIS — E611 Iron deficiency: Secondary | ICD-10-CM

## 2020-06-06 DIAGNOSIS — D509 Iron deficiency anemia, unspecified: Secondary | ICD-10-CM | POA: Diagnosis not present

## 2020-06-06 MED ORDER — SODIUM CHLORIDE 0.9 % IV SOLN: 200.0000 mg | Freq: Once | INTRAVENOUS | Status: DC

## 2020-06-06 MED ORDER — IRON SUCROSE 20 MG/ML IV SOLN
200.0000 mg | Freq: Once | INTRAVENOUS | Status: AC
  Administered 2020-06-06: 200 mg via INTRAVENOUS
  Filled 2020-06-06: qty 10

## 2020-06-06 MED ORDER — SODIUM CHLORIDE 0.9 % IV SOLN
Freq: Once | INTRAVENOUS | Status: AC
  Filled 2020-06-06: qty 250

## 2020-06-06 NOTE — Progress Notes (Signed)
Pt tolerated Venofer infusion well with no signs of complications. RN educated pt on the importance of notifying the clinic if any complications occur at home. Pt verbalized understanding. VSS. Pt stable for discharge.   Andrya Roppolo [employer]

## 2020-06-13 ENCOUNTER — Other Ambulatory Visit: Payer: Self-pay

## 2020-06-13 ENCOUNTER — Inpatient Hospital Stay: Payer: 59

## 2020-06-13 VITALS — BP 119/73 | HR 65 | Temp 97.7°F | Resp 16

## 2020-06-13 DIAGNOSIS — E611 Iron deficiency: Secondary | ICD-10-CM

## 2020-06-13 DIAGNOSIS — D509 Iron deficiency anemia, unspecified: Secondary | ICD-10-CM | POA: Diagnosis not present

## 2020-06-13 MED ORDER — IRON SUCROSE 20 MG/ML IV SOLN
200.0000 mg | Freq: Once | INTRAVENOUS | Status: AC
  Administered 2020-06-13: 200 mg via INTRAVENOUS
  Filled 2020-06-13: qty 10

## 2020-06-13 MED ORDER — SODIUM CHLORIDE 0.9 % IV SOLN: 200.0000 mg | Freq: Once | INTRAVENOUS | Status: DC

## 2020-06-13 MED ORDER — SODIUM CHLORIDE 0.9 % IV SOLN
Freq: Once | INTRAVENOUS | Status: AC
  Filled 2020-06-13: qty 250

## 2020-06-20 ENCOUNTER — Other Ambulatory Visit: Payer: Self-pay

## 2020-06-20 ENCOUNTER — Inpatient Hospital Stay: Payer: 59 | Attending: Oncology

## 2020-06-20 DIAGNOSIS — N920 Excessive and frequent menstruation with regular cycle: Secondary | ICD-10-CM | POA: Insufficient documentation

## 2020-06-20 DIAGNOSIS — E611 Iron deficiency: Secondary | ICD-10-CM

## 2020-06-20 DIAGNOSIS — D500 Iron deficiency anemia secondary to blood loss (chronic): Secondary | ICD-10-CM | POA: Insufficient documentation

## 2020-06-20 MED ORDER — SODIUM CHLORIDE 0.9 % IV SOLN
Freq: Once | INTRAVENOUS | Status: DC
  Filled 2020-06-20: qty 250

## 2020-06-20 MED ORDER — IRON SUCROSE 20 MG/ML IV SOLN: 200.0000 mg | Freq: Once | INTRAVENOUS | Status: DC

## 2020-06-20 MED ORDER — SODIUM CHLORIDE 0.9 % IV SOLN: 200.0000 mg | Freq: Once | INTRAVENOUS | Status: DC

## 2020-06-20 NOTE — Progress Notes (Signed)
Unable to obtain IV access. Attempted by 3 RNs. Patient to return next week for scheduled appointment. Instructed to drink plenty of fluids 2-3 days prior and on day of  appointment. Patient verbalized understanding.

## 2020-06-27 ENCOUNTER — Inpatient Hospital Stay: Payer: 59

## 2020-06-27 ENCOUNTER — Other Ambulatory Visit: Payer: Self-pay

## 2020-06-27 VITALS — BP 122/73 | HR 62 | Temp 98.0°F | Resp 18

## 2020-06-27 DIAGNOSIS — E611 Iron deficiency: Secondary | ICD-10-CM

## 2020-06-27 DIAGNOSIS — D500 Iron deficiency anemia secondary to blood loss (chronic): Secondary | ICD-10-CM | POA: Diagnosis present

## 2020-06-27 DIAGNOSIS — N920 Excessive and frequent menstruation with regular cycle: Secondary | ICD-10-CM | POA: Diagnosis not present

## 2020-06-27 MED ORDER — IRON SUCROSE 20 MG/ML IV SOLN
200.0000 mg | Freq: Once | INTRAVENOUS | Status: AC
  Administered 2020-06-27: 200 mg via INTRAVENOUS
  Filled 2020-06-27: qty 10

## 2020-06-27 MED ORDER — SODIUM CHLORIDE 0.9 % IV SOLN
Freq: Once | INTRAVENOUS | Status: AC
  Filled 2020-06-27: qty 250

## 2020-06-27 MED ORDER — SODIUM CHLORIDE 0.9 % IV SOLN: 200.0000 mg | Freq: Once | INTRAVENOUS | Status: DC

## 2020-07-04 ENCOUNTER — Other Ambulatory Visit: Payer: Self-pay

## 2020-07-04 ENCOUNTER — Telehealth: Payer: Self-pay

## 2020-07-04 ENCOUNTER — Inpatient Hospital Stay: Payer: 59

## 2020-07-04 NOTE — Progress Notes (Signed)
Dr. Tasia Catchings and team made aware that patient was stuck by multiple nurses x 4 with no success and has been discharged home.

## 2020-07-04 NOTE — Telephone Encounter (Signed)
Secure message from Infusion nurse, [place]: FYI, patient was stuck 4 times by multiple nurses with no success. She has left.  Per Dr. Tasia Catchings: please ask her if she would consider having medi port placed due to poor IV access. if she is interested, see if she is known to any Garment/textile technologist. if not, please refer to [place] for port

## 2020-07-06 NOTE — Telephone Encounter (Signed)
patient responded via [place]. Patient states that she is hesistant about getting port or PICC due to only having missed 1 iron treatment. She would like to wait until October labs and see if she will need additional iron treatments to make decision about port/ PICC. At this point patient doesn't want to reschedule last iron treatment and will keep October appts as is.

## 2020-08-29 ENCOUNTER — Inpatient Hospital Stay: Payer: Self-pay | Admitting: Oncology

## 2020-08-29 ENCOUNTER — Inpatient Hospital Stay: Payer: Self-pay

## 2020-08-30 ENCOUNTER — Inpatient Hospital Stay: Payer: Self-pay

## 2020-09-12 ENCOUNTER — Inpatient Hospital Stay: Payer: Self-pay | Attending: Oncology

## 2020-09-12 ENCOUNTER — Inpatient Hospital Stay: Payer: Self-pay | Admitting: Oncology

## 2020-09-13 ENCOUNTER — Inpatient Hospital Stay: Payer: Self-pay

## 2020-11-28 ENCOUNTER — Emergency Department
Admission: EM | Admit: 2020-11-28 | Discharge: 2020-11-28 | Disposition: A | Discharge status: Home / Self Care | Payer: 59 | Attending: Emergency Medicine | Admitting: Emergency Medicine

## 2020-11-28 ENCOUNTER — Other Ambulatory Visit: Payer: Self-pay

## 2020-11-28 DIAGNOSIS — Z79899 Other long term (current) drug therapy: Secondary | ICD-10-CM | POA: Diagnosis not present

## 2020-11-28 DIAGNOSIS — R55 Syncope and collapse: Secondary | ICD-10-CM | POA: Insufficient documentation

## 2020-11-28 DIAGNOSIS — Z87891 Personal history of nicotine dependence: Secondary | ICD-10-CM | POA: Diagnosis not present

## 2020-11-28 DIAGNOSIS — Z20822 Contact with and (suspected) exposure to covid-19: Secondary | ICD-10-CM | POA: Insufficient documentation

## 2020-11-28 DIAGNOSIS — D509 Iron deficiency anemia, unspecified: Secondary | ICD-10-CM | POA: Diagnosis not present

## 2020-11-28 LAB — POC SARS CORONAVIRUS 2 AG -  ED: SARS Coronavirus 2 Ag: NEGATIVE

## 2020-11-28 LAB — CBC
HCT: 32.5 % — ABNORMAL LOW (ref 36.0–46.0)
Hemoglobin: 9.6 g/dL — ABNORMAL LOW (ref 12.0–15.0)
MCH: 22.4 pg — ABNORMAL LOW (ref 26.0–34.0)
MCHC: 29.5 g/dL — ABNORMAL LOW (ref 30.0–36.0)
MCV: 75.8 fL — ABNORMAL LOW (ref 80.0–100.0)
Platelets: 341 10*3/uL (ref 150–400)
RBC: 4.29 MIL/uL (ref 3.87–5.11)
RDW: 17.5 % — ABNORMAL HIGH (ref 11.5–15.5)
WBC: 7 10*3/uL (ref 4.0–10.5)
nRBC: 0 % (ref 0.0–0.2)

## 2020-11-28 LAB — BASIC METABOLIC PANEL
Anion gap: 10 (ref 5–15)
BUN: 10 mg/dL (ref 6–20)
CO2: 24 mmol/L (ref 22–32)
Calcium: 9 mg/dL (ref 8.9–10.3)
Chloride: 105 mmol/L (ref 98–111)
Creatinine, Ser: 0.68 mg/dL (ref 0.44–1.00)
GFR, Estimated: >60 mL/min (ref >60)
Glucose, Bld: 92 mg/dL (ref 70–99)
Potassium: 3.9 mmol/L (ref 3.5–5.1)
Sodium: 139 mmol/L (ref 135–145)

## 2020-11-28 LAB — IRON AND TIBC
Iron: 17 ug/dL — ABNORMAL LOW (ref 28–170)
Saturation Ratios: 3 % — ABNORMAL LOW (ref 10.4–31.8)
TIBC: 557 ug/dL — ABNORMAL HIGH (ref 250–450)
UIBC: 540 ug/dL

## 2020-11-28 LAB — TROPONIN I (HIGH SENSITIVITY): Troponin I (High Sensitivity): <2 ng/L (ref <18)

## 2020-11-28 MED ORDER — FERROUS SULFATE 325 (65 FE) MG PO TABS: 325.0000 mg | ORAL_TABLET | Freq: Two times a day (BID) | ORAL | 2 refills | Status: DC

## 2020-11-28 NOTE — ED Triage Notes (Signed)
Pt comes with c/o near syncopal episode. Pt states dizziness weakness and possibly dehydrated. Pt states this all started today. Pt states issues in past of same.

## 2020-11-28 NOTE — ED Provider Notes (Signed)
[facility] Emergency Department Provider Note  Time seen: 3:27 PM  I have reviewed the triage vital signs and the nursing notes.   HISTORY  Chief Complaint Near Syncope   HPI Tammy Todd is a 40 y.o. female with a past medical history of anemia, anxiety, gastric reflux, presents to the emergency department after syncopal episode. According to the patient she was walking around when she felt lightheaded and had a syncopal episode. Patient denies any chest pain at any point. Denies any shortness of breath cough or fever. Patient states a history of anemia, was on iron infusions until 1 year ago when she had to stop due to vascular access issues. Patient states she was never restarted on oral therapy either. Has not been following up since. Patient states a history of heavy menstrual cycles causing her anemia. Currently on her menstrual cycle. Denies any abdominal pain vomiting diarrhea or dysuria.   Past Medical History:  Diagnosis Date  . Anemia   . Anxiety   . Depression   . GERD (gastroesophageal reflux disease)   . H. pylori infection   . Hiatal hernia   . Scoliosis     Patient Active Problem List   Diagnosis Date Noted  . Vitamin D deficiency 12/13/2018  . Abnormal weight gain 11/03/2018  . Chronic midline low back pain without sciatica 11/03/2018  . Class 3 severe obesity due to excess calories with serious comorbidity and body mass index (BMI) of 45.0 to 49.9 in adult ([place]) 11/03/2018  . Encounter for weight loss counseling 11/03/2018  . Generalized anxiety disorder 11/03/2018  . Iron deficiency 04/03/2015  . Dysmenorrhea 08/03/2014  . Female fertility problem 08/03/2014    Past Surgical History:  Procedure Laterality Date  . BARIATRIC SURGERY    . COLONOSCOPY WITH PROPOFOL N/A 09/22/2019   Procedure: COLONOSCOPY WITH PROPOFOL;  Surgeon: Toledo, Benay Pike, MD;  Location: [facility] ENDOSCOPY;  Service: Endoscopy;  Laterality: N/A;  .  ESOPHAGOGASTRODUODENOSCOPY (EGD) WITH PROPOFOL N/A 09/22/2019   Procedure: ESOPHAGOGASTRODUODENOSCOPY (EGD) WITH PROPOFOL;  Surgeon: Toledo, Benay Pike, MD;  Location: [facility] ENDOSCOPY;  Service: Endoscopy;  Laterality: N/A;    Prior to Admission medications   Medication Sig Start Date End Date Taking? Authorizing Provider  Ascorbic Acid (VITAMIN C) 100 MG tablet Take by mouth.    [provider]  benzonatate (TESSALON) 200 MG capsule Take 1 capsule (200 mg total) by mouth 3 (three) times daily as needed. 08/29/19   Norval Gable, MD  buPROPion (WELLBUTRIN SR) 150 MG 12 hr tablet Take by mouth. Patient not taking: Reported on 05/31/2020 11/03/18   [provider]  Calcium Carbonate (CALCIUM 500 PO) Take 1 tablet by mouth 4 (four) times daily.    [provider]  cyclobenzaprine (FLEXERIL) 10 MG tablet cyclobenzaprine 10 mg tablet Patient not taking: Reported on 05/31/2020 09/08/18   [provider]  dicyclomine (BENTYL) 10 MG capsule Take 10 mg by mouth 4 (four) times daily -  before meals and at bedtime. Patient not taking: Reported on 05/31/2020    [provider]  ergocalciferol (VITAMIN D2) 1.25 MG (50000 UT) capsule Take 50,000 Units by mouth once a week.    [provider]  ferrous fumarate (HEMOCYTE - 106 MG FE) 325 (106 FE) MG TABS tablet Take 1 tablet by mouth 2 (two) times daily.    [provider]  gabapentin (NEURONTIN) 300 MG capsule Take 300 mg by mouth 3 (three) times daily. Patient not taking: Reported  on 05/31/2020    [provider]  hydrOXYzine (ATARAX/VISTARIL) 10 MG tablet hydroxyzine HCl 10 mg tablet Patient not taking: Reported on 05/31/2020 07/18/18   [provider]  meloxicam (MOBIC) 15 MG tablet  10/05/18   [provider]  Multiple Vitamins-Minerals (MULTIVITAMIN ADULT EXTRA C) CHEW Chew 1 tablet by mouth daily.    [provider]  pantoprazole (PROTONIX) 20 MG tablet Take 20 mg  by mouth daily. Patient not taking: Reported on 05/31/2020    [provider]  promethazine (PHENERGAN) 25 MG tablet Take 1 tablet (25 mg total) by mouth every 4 (four) hours as needed for nausea or vomiting. Patient not taking: Reported on 05/31/2020 11/15/19   Earleen Newport, MD  sertraline (ZOLOFT) 50 MG tablet  10/05/18   [provider]  terbinafine (LAMISIL) 250 MG tablet Take 1 tablet (250 mg total) by mouth daily. Patient not taking: Reported on 05/31/2020 08/26/19   Edrick Kins, DPM  traMADol Veatrice Bourbon) 50 MG tablet tramadol 50 mg tablet Patient not taking: Reported on 05/31/2020 09/08/18   [provider]    Allergies  Allergen Reactions  . Cashew Nut Oil Other (See Comments)    Lips tingle and swell Lips tingle and swell   . Penicillins   . Pistachio Nut Extract Skin Test Nausea And Vomiting and Other (See Comments)    Lips tingle and swell Lips tingle and swell     Family History  Problem Relation Age of Onset  . Diabetes Mother   . Hyperlipidemia Mother   . Hypertension Mother   . Alcohol abuse Father   . Hypertension Father   . Obesity Father   . Hyperlipidemia Father   . Diabetes Father   . Depression Sister   . Anxiety disorder Sister   . Diabetes Sister   . Diabetes Brother   . Hyperlipidemia Brother   . Hypertension Brother   . Obesity Paternal Grandmother   . Breast cancer Paternal Aunt   . Stomach cancer Paternal Uncle     Social History Social History   Tobacco Use  . Smoking status: Former Smoker    Years: 9.00    Quit date: 12/2009    Years since quitting: 10.9  . Smokeless tobacco: Never Used  Vaping Use  . Vaping Use: Never used  Substance Use Topics  . Alcohol use: Yes    Comment: social  . Drug use: No    Review of Systems Constitutional: Negative for fever. Cardiovascular: Negative for chest pain. Respiratory: Negative for shortness of breath. Gastrointestinal: Negative for abdominal pain,  vomiting and diarrhea. Genitourinary: Negative for urinary compaints. Currently under menstrual cycle. Musculoskeletal: Negative for musculoskeletal complaints Neurological: Negative for headache All other ROS negative  ____________________________________________   PHYSICAL EXAM:  VITAL SIGNS: ED Triage Vitals  Enc Vitals Group     BP 11/28/20 1313 128/70     Pulse Rate 11/28/20 1313 67     Resp 11/28/20 1515 18     Temp 11/28/20 1313 98.4 F (36.9 C)     Temp Source 11/28/20 1313 Oral     SpO2 11/28/20 1313 100 %     Weight --      Height --      Head Circumference --      Peak Flow --      Pain Score 11/28/20 1326 0     Pain Loc --      Pain Edu? --      Excl.  in [place]? --    Constitutional: Alert and oriented. Well appearing and in no distress. Eyes: Normal exam ENT      Head: Normocephalic and atraumatic.      Mouth/Throat: Mucous membranes are moist. Cardiovascular: Normal rate, regular rhythm. Respiratory: Normal respiratory effort without tachypnea nor retractions. Breath sounds are clear  Gastrointestinal: Soft and nontender. No distention.   Musculoskeletal: Nontender with normal range of motion in all extremities.  Neurologic:  Normal speech and language. No gross focal neurologic deficits  Skin:  Skin is warm, dry and intact.  Psychiatric: Mood and affect are normal.  ____________________________________________    EKG  EKG viewed and interpreted by myself shows normal sinus rhythm at 65 bpm with a narrow QRS, normal axis, normal intervals, nonspecific ST changes. No ST elevations.  ____________________________________________   INITIAL IMPRESSION / ASSESSMENT AND PLAN / ED COURSE  Pertinent labs & imaging results that were available during my care of the patient were reviewed by me and considered in my medical decision making (see chart for details).   Patient presents to the emergency department for syncopal episode earlier today around 10 AM.  Overall the patient appears well. Denies any recent illnesses nausea vomiting or diarrhea. No black or bloody stool. Currently on her menstrual cycle. Patient has a history of anemia and menorrhagia. Has not taken iron for over 1 year. Patient's hemoglobin currently 9.6 but this is decreased from thirteen 1 year ago when the patient was taking iron infusions. Iron panel has resulted showing very low iron and elevated TIBC indicating iron deficiency. Patient's physical exam is extremely reassuring, vitals reassuring and the remainder of the lab work is reassuring as well. I have added on cardiac enzymes and a rapid COVID swab as a precaution. We will restart the patient on oral iron therapy and I discussed with the patient follow-up with her PCP to recheck her iron level within the next several weeks. I also discussed return precautions. Patient agreeable to plan  Patient's troponin is negative.  COVID test negative.  Overall patient continues to appear very well.  We will discharge patient home with iron supplements.  Patient agreeable to plan of care and will follow-up with her doctor.  Discussed return precautions.  Tammy Todd was evaluated in Emergency Department on 11/28/2020 for the symptoms described in the history of present illness. She was evaluated in the context of the global COVID-19 pandemic, which necessitated consideration that the patient might be at risk for infection with the SARS-CoV-2 virus that causes COVID-19. Institutional protocols and algorithms that pertain to the evaluation of patients at risk for COVID-19 are in a state of rapid change based on information released by regulatory bodies including the CDC and federal and state organizations. These policies and algorithms were followed during the patient's care in the ED.  ____________________________________________   FINAL CLINICAL IMPRESSION(S) / ED DIAGNOSES  Syncope Iron deficiency anemia   Harvest Dark,  MD 11/28/20 [phone]

## 2021-01-20 IMAGING — US US THYROID
1 series · 14 of 25 positions shown · non-contrast
Comparison: None.

CLINICAL DATA: Abnormal thyroid on physical exam 1 month ago

EXAM:
THYROID ULTRASOUND
TECHNIQUE: Ultrasound examination of the thyroid gland and adjacent soft
tissues was performed.

[Series 1: us thyroid · 0.07mm/px · 14 of 42 slices shown]
[im 1/42]
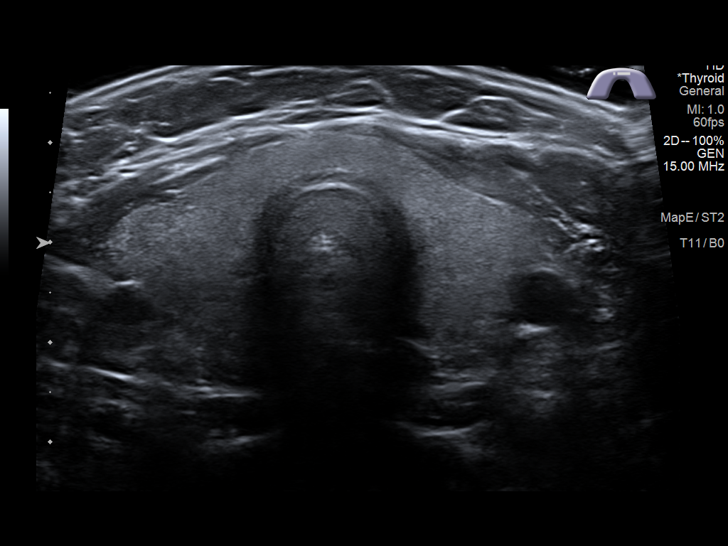
[im 4/42]
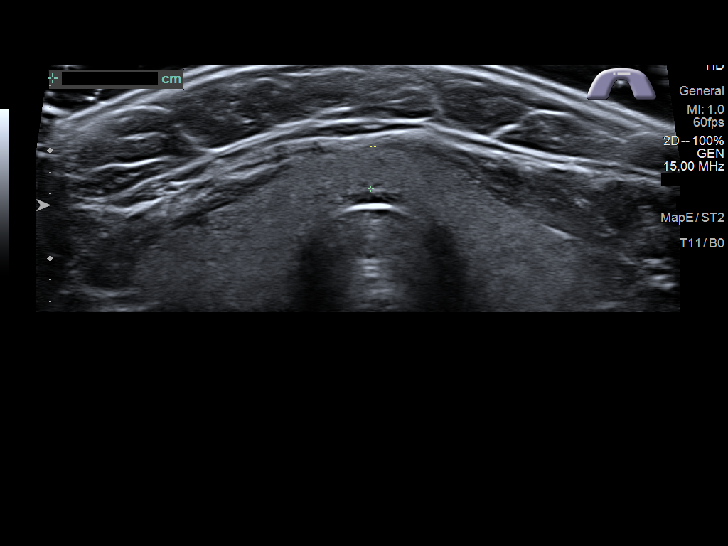
[im 7/42]
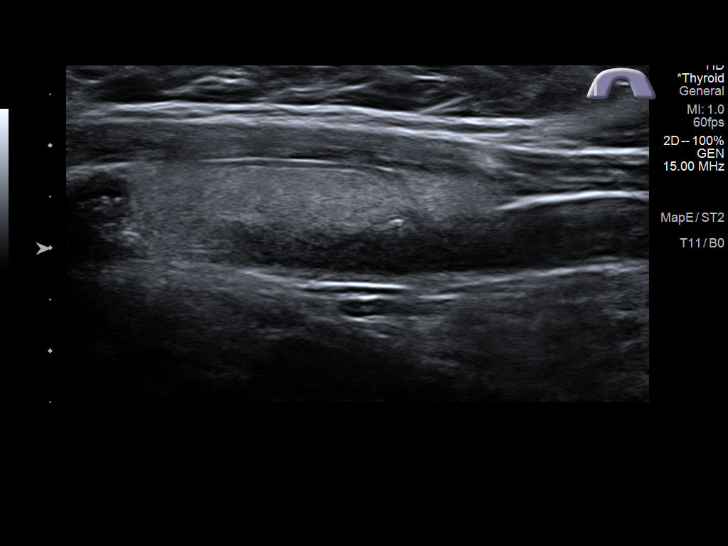
[im 11/42]
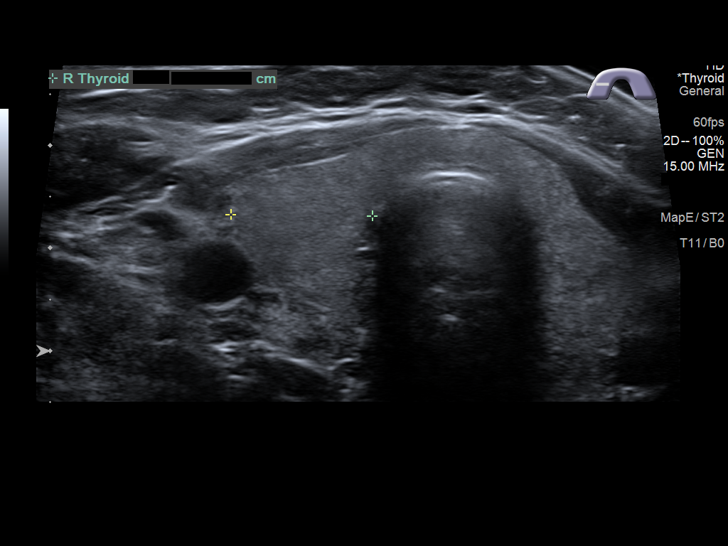
[im 14/42]
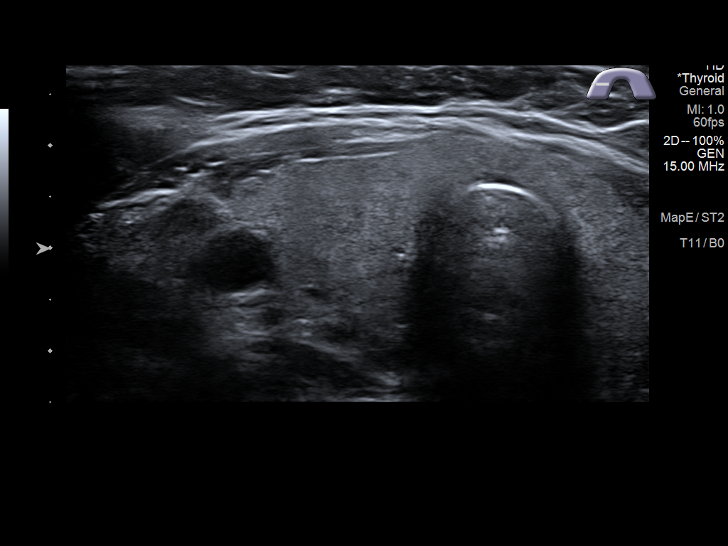
[im 16/42]
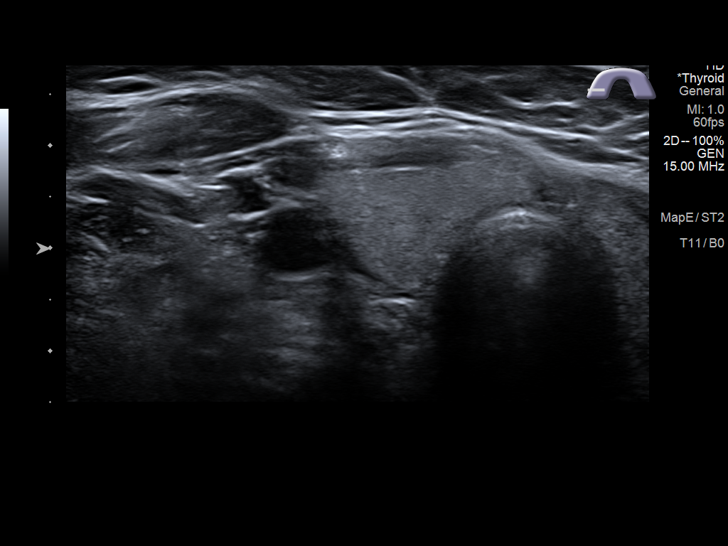
[im 19/42]
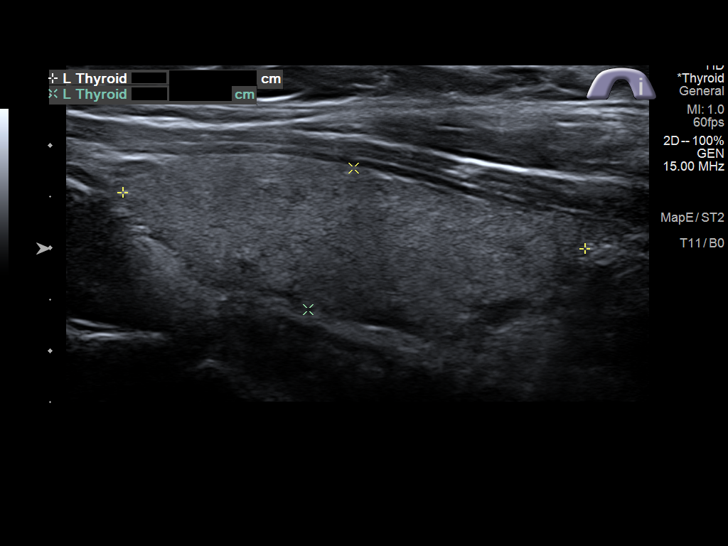
[im 23/42]
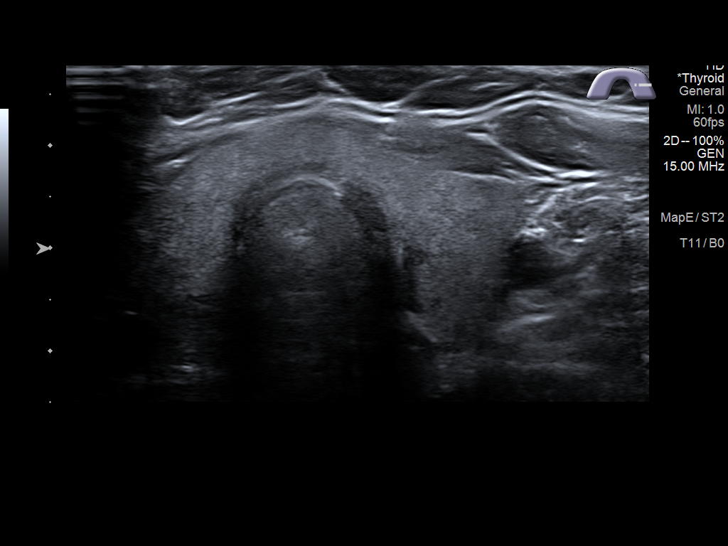
[im 26/42]
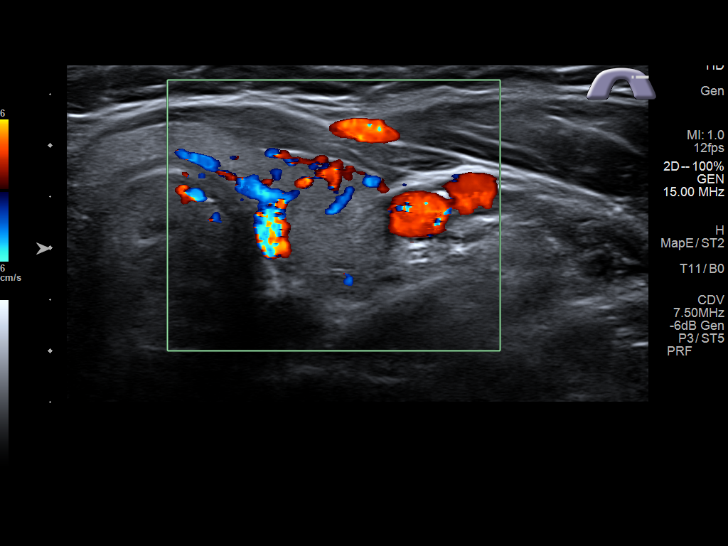
[im 28/42]
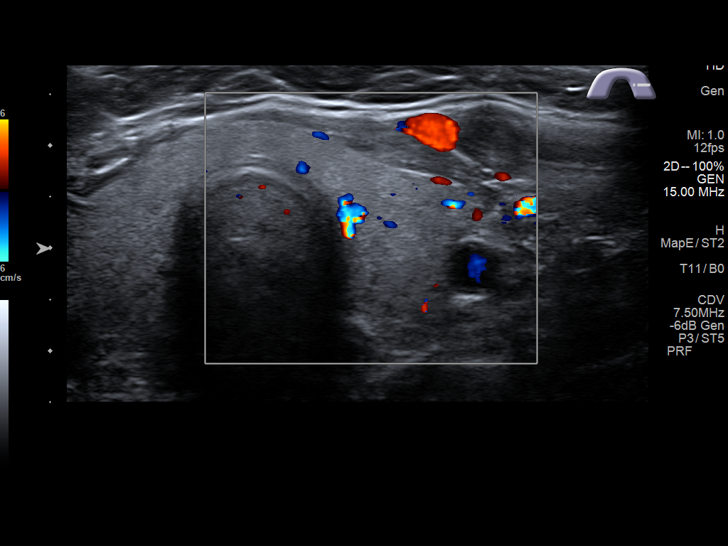
[im 31/42]
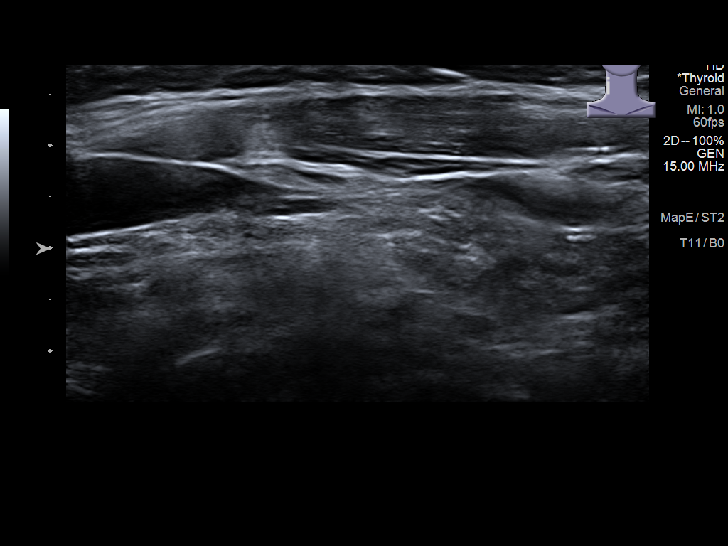
[im 35/42]
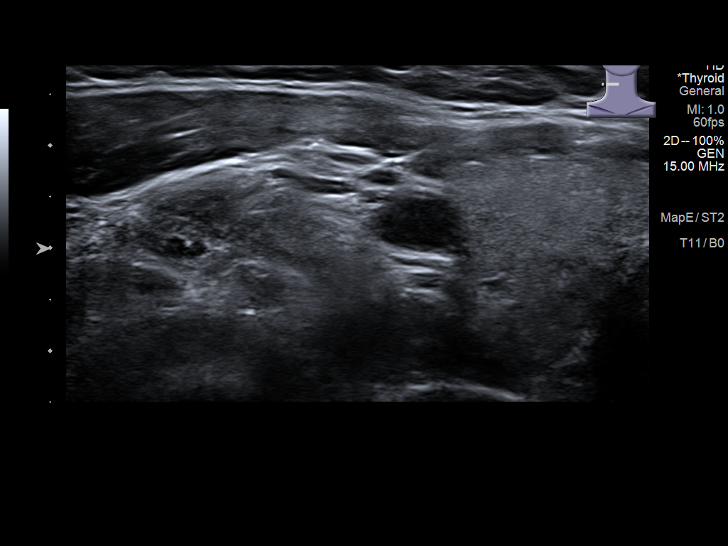
[im 38/42]
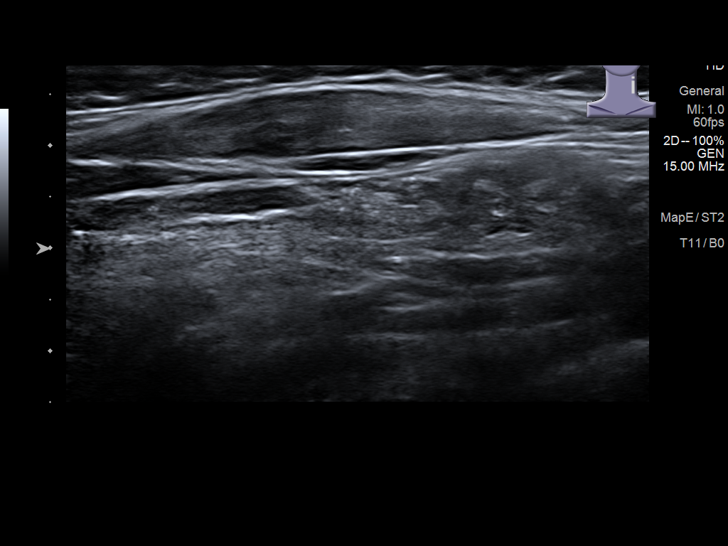
[im 42/42]
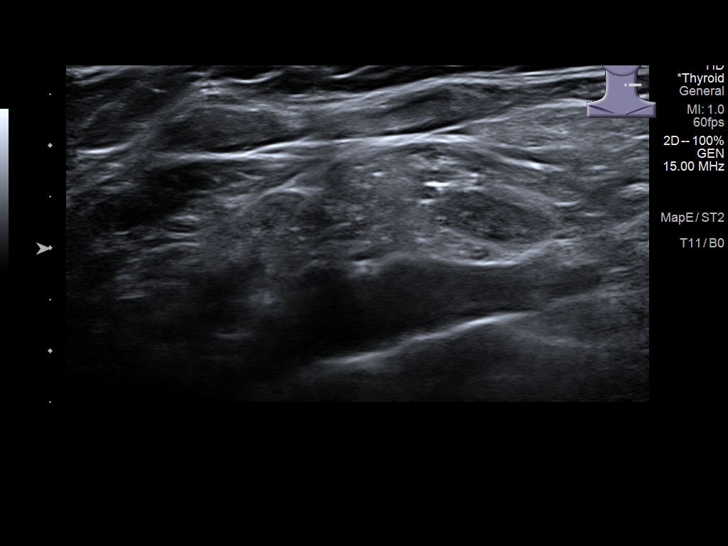

[14 of 25 positions shown; findings below may reference images not displayed]

FINDINGS: Parenchymal Echotexture: Mildly heterogenous

Isthmus: 0.4 cm thickness

Right lobe: 4.5 x 1.6 x 1.4 cm

Left lobe: 4.5 x 1.5 x 1.3 cm

_________________________________________________________

Estimated total number of nodules >/= 1 cm: 0

Number of spongiform nodules >/=  2 cm not described below (TR1): 0

Number of mixed cystic and solid nodules >/= 1.5 cm not described
below (TR2): 0

_________________________________________________________

No discrete nodules are seen within the thyroid gland.
IMPRESSION: Negative thyroid

The above is in keeping with the ACR TI-RADS recommendations - [HOSPITAL] 5044;[DATE].

## 2021-04-20 ENCOUNTER — Encounter: Payer: Self-pay | Admitting: Oncology

## 2021-04-21 ENCOUNTER — Emergency Department
Admission: EM | Admit: 2021-04-21 | Discharge: 2021-04-21 | Disposition: A | Discharge status: Home / Self Care | Payer: 59 | Attending: Emergency Medicine | Admitting: Emergency Medicine

## 2021-04-21 ENCOUNTER — Other Ambulatory Visit: Payer: Self-pay

## 2021-04-21 DIAGNOSIS — R5383 Other fatigue: Secondary | ICD-10-CM | POA: Diagnosis not present

## 2021-04-21 DIAGNOSIS — D649 Anemia, unspecified: Secondary | ICD-10-CM | POA: Insufficient documentation

## 2021-04-21 DIAGNOSIS — R001 Bradycardia, unspecified: Secondary | ICD-10-CM | POA: Diagnosis not present

## 2021-04-21 DIAGNOSIS — Z87891 Personal history of nicotine dependence: Secondary | ICD-10-CM | POA: Insufficient documentation

## 2021-04-21 LAB — BASIC METABOLIC PANEL
Anion gap: 7 (ref 5–15)
BUN: 16 mg/dL (ref 6–20)
CO2: 22 mmol/L (ref 22–32)
Calcium: 8.9 mg/dL (ref 8.9–10.3)
Chloride: 108 mmol/L (ref 98–111)
Creatinine, Ser: 0.79 mg/dL (ref 0.44–1.00)
GFR, Estimated: >60 mL/min (ref >60)
Glucose, Bld: 90 mg/dL (ref 70–99)
Potassium: 4.1 mmol/L (ref 3.5–5.1)
Sodium: 137 mmol/L (ref 135–145)

## 2021-04-21 LAB — CBC WITH DIFFERENTIAL/PLATELET
Abs Immature Granulocytes: 0.01 10*3/uL (ref 0.00–0.07)
Basophils Absolute: 0 10*3/uL (ref 0.0–0.1)
Basophils Relative: 1 %
Eosinophils Absolute: 0.1 10*3/uL (ref 0.0–0.5)
Eosinophils Relative: 1 %
HCT: 31.6 % — ABNORMAL LOW (ref 36.0–46.0)
Hemoglobin: 9.6 g/dL — ABNORMAL LOW (ref 12.0–15.0)
Immature Granulocytes: 0 %
Lymphocytes Relative: 24 %
Lymphs Abs: 1.9 10*3/uL (ref 0.7–4.0)
MCH: 21.2 pg — ABNORMAL LOW (ref 26.0–34.0)
MCHC: 30.4 g/dL (ref 30.0–36.0)
MCV: 69.9 fL — ABNORMAL LOW (ref 80.0–100.0)
Monocytes Absolute: 0.6 10*3/uL (ref 0.1–1.0)
Monocytes Relative: 8 %
Neutro Abs: 5.6 10*3/uL (ref 1.7–7.7)
Neutrophils Relative %: 66 %
Platelets: 296 10*3/uL (ref 150–400)
RBC: 4.52 MIL/uL (ref 3.87–5.11)
RDW: 18.4 % — ABNORMAL HIGH (ref 11.5–15.5)
Smear Review: NORMAL
WBC: 8.3 10*3/uL (ref 4.0–10.5)
nRBC: 0 % (ref 0.0–0.2)

## 2021-04-21 NOTE — ED Triage Notes (Signed)
Pt to ED for abnormal labs on Friday. Ferritin and iron decreased. hgb 9.6. reports increased fatigue and dizziness.  NAd noted. RR even and unlabroed

## 2021-04-21 NOTE — ED Provider Notes (Signed)
[facility] Emergency Department Provider Note  ____________________________________________   Event Date/Time   First MD Initiated Contact with Patient 04/21/21 1451     (approximate)  I have reviewed the triage vital signs and the nursing notes.   HISTORY  Chief Complaint abnormal labs   HPI Tammy Todd is a 40 y.o. female the past patient of anxiety, depression, GERD, hiatal hernia, scoliosis and obesity status post bariatric surgery complicated by iron deficiency anemia who presents with concerns that her ferritin is quite low.  She states she had routine CBC drawn this past Friday on 5/3 and saw her ferritin was 4 and was worried that this needed to get improved in ED.  She states she has been feeling fatigued for a long time and has previously gotten iron transfusions and is on a p.o. supplement but feels this is not helping her fatigue.  She denies any chest pain, syncopal episodes, dizziness, cough, shortness of breath, abdominal pain, back pain, rash or extremity pain.  She states because of her iron deficiency anemia is from heavy menstrual periods.  She has no other acute concerns at this time other than being worried about her labs showing low ferritin.         Past Medical History:  Diagnosis Date  . Anemia   . Anxiety   . Depression   . GERD (gastroesophageal reflux disease)   . H. pylori infection   . Hiatal hernia   . Scoliosis     Patient Active Problem List   Diagnosis Date Noted  . Vitamin D deficiency 12/13/2018  . Abnormal weight gain 11/03/2018  . Chronic midline low back pain without sciatica 11/03/2018  . Class 3 severe obesity due to excess calories with serious comorbidity and body mass index (BMI) of 45.0 to 49.9 in adult ([place]) 11/03/2018  . Encounter for weight loss counseling 11/03/2018  . Generalized anxiety disorder 11/03/2018  . Iron deficiency 04/03/2015  . Dysmenorrhea 08/03/2014  . Female fertility problem  08/03/2014    Past Surgical History:  Procedure Laterality Date  . BARIATRIC SURGERY    . COLONOSCOPY WITH PROPOFOL N/A 09/22/2019   Procedure: COLONOSCOPY WITH PROPOFOL;  Surgeon: Toledo, Benay Pike, MD;  Location: [facility] ENDOSCOPY;  Service: Endoscopy;  Laterality: N/A;  . ESOPHAGOGASTRODUODENOSCOPY (EGD) WITH PROPOFOL N/A 09/22/2019   Procedure: ESOPHAGOGASTRODUODENOSCOPY (EGD) WITH PROPOFOL;  Surgeon: Toledo, Benay Pike, MD;  Location: [facility] ENDOSCOPY;  Service: Endoscopy;  Laterality: N/A;    Prior to Admission medications   Medication Sig Start Date End Date Taking? Authorizing Provider  Ascorbic Acid (VITAMIN C) 100 MG tablet Take by mouth.    [provider]  benzonatate (TESSALON) 200 MG capsule Take 1 capsule (200 mg total) by mouth 3 (three) times daily as needed. 08/29/19   Norval Gable, MD  buPROPion (WELLBUTRIN SR) 150 MG 12 hr tablet Take by mouth. Patient not taking: Reported on 05/31/2020 11/03/18   [provider]  Calcium Carbonate (CALCIUM 500 PO) Take 1 tablet by mouth 4 (four) times daily.    [provider]  cyclobenzaprine (FLEXERIL) 10 MG tablet cyclobenzaprine 10 mg tablet Patient not taking: Reported on 05/31/2020 09/08/18   [provider]  dicyclomine (BENTYL) 10 MG capsule Take 10 mg by mouth 4 (four) times daily -  before meals and at bedtime. Patient not taking: Reported on 05/31/2020    [provider]  ergocalciferol (VITAMIN D2) 1.25 MG (50000 UT) capsule Take 50,000 Units by mouth once  a week.    [provider]  ferrous fumarate (HEMOCYTE - 106 MG FE) 325 (106 FE) MG TABS tablet Take 1 tablet by mouth 2 (two) times daily.    [provider]  ferrous sulfate 325 (65 FE) MG tablet Take 1 tablet (325 mg total) by mouth 2 (two) times daily with a meal. 11/28/20 02/26/21  Harvest Dark, MD  gabapentin (NEURONTIN) 300 MG capsule Take 300 mg by mouth 3 (three) times daily. Patient not taking: Reported  on 05/31/2020    [provider]  hydrOXYzine (ATARAX/VISTARIL) 10 MG tablet hydroxyzine HCl 10 mg tablet Patient not taking: Reported on 05/31/2020 07/18/18   [provider]  meloxicam (MOBIC) 15 MG tablet  10/05/18   [provider]  Multiple Vitamins-Minerals (MULTIVITAMIN ADULT EXTRA C) CHEW Chew 1 tablet by mouth daily.    [provider]  pantoprazole (PROTONIX) 20 MG tablet Take 20 mg by mouth daily. Patient not taking: Reported on 05/31/2020    [provider]  promethazine (PHENERGAN) 25 MG tablet Take 1 tablet (25 mg total) by mouth every 4 (four) hours as needed for nausea or vomiting. Patient not taking: Reported on 05/31/2020 11/15/19   Earleen Newport, MD  sertraline (ZOLOFT) 50 MG tablet  10/05/18   [provider]  terbinafine (LAMISIL) 250 MG tablet Take 1 tablet (250 mg total) by mouth daily. Patient not taking: Reported on 05/31/2020 08/26/19   Edrick Kins, DPM  traMADol Veatrice Bourbon) 50 MG tablet tramadol 50 mg tablet Patient not taking: Reported on 05/31/2020 09/08/18   [provider]    Allergies Cashew nut oil, Penicillins, and Pistachio nut extract skin test  Family History  Problem Relation Age of Onset  . Diabetes Mother   . Hyperlipidemia Mother   . Hypertension Mother   . Alcohol abuse Father   . Hypertension Father   . Obesity Father   . Hyperlipidemia Father   . Diabetes Father   . Depression Sister   . Anxiety disorder Sister   . Diabetes Sister   . Diabetes Brother   . Hyperlipidemia Brother   . Hypertension Brother   . Obesity Paternal Grandmother   . Breast cancer Paternal Aunt   . Stomach cancer Paternal Uncle     Social History Social History   Tobacco Use  . Smoking status: Former Smoker    Years: 9.00    Quit date: 12/2009    Years since quitting: 11.3  . Smokeless tobacco: Never Used  Vaping Use  . Vaping Use: Never used  Substance Use Topics  . Alcohol use: Yes     Comment: social  . Drug use: No    Review of Systems  Review of Systems  Constitutional: Positive for malaise/fatigue. Negative for chills and fever.  HENT: Negative for sore throat.   Eyes: Negative for pain.  Respiratory: Negative for cough and stridor.   Cardiovascular: Negative for chest pain.  Gastrointestinal: Negative for vomiting.  Genitourinary: Negative for dysuria.  Musculoskeletal: Negative for myalgias.  Skin: Negative for rash.  Neurological: Negative for seizures, loss of consciousness and headaches.  Psychiatric/Behavioral: Negative for suicidal ideas.  All other systems reviewed and are negative.     ____________________________________________   PHYSICAL EXAM:  VITAL SIGNS: ED Triage Vitals  Enc Vitals Group     BP 04/21/21 1252 127/78     Pulse Rate 04/21/21 1252 (!) 57     Resp 04/21/21 1252 17     Temp 04/21/21  1252 98.9 F (37.2 C)     Temp Source 04/21/21 1252 Oral     SpO2 04/21/21 1252 100 %     Weight 04/21/21 1204 139 lb (63 kg)     Height 04/21/21 1204 5\' 3"  (1.6 m)     Head Circumference --      Peak Flow --      Pain Score 04/21/21 1204 0     Pain Loc --      Pain Edu? --      Excl. in [place]? --    Vitals:   04/21/21 1252  BP: 127/78  Pulse: (!) 57  Resp: 17  Temp: 98.9 F (37.2 C)  SpO2: 100%   Physical Exam Vitals and nursing note reviewed.  Constitutional:      General: She is not in acute distress.    Appearance: She is well-developed.  HENT:     Head: Normocephalic and atraumatic.     Right Ear: External ear normal.     Left Ear: External ear normal.     Nose: Nose normal.  Eyes:     Conjunctiva/sclera: Conjunctivae normal.  Cardiovascular:     Rate and Rhythm: Normal rate and regular rhythm.     Heart sounds: No murmur heard.   Pulmonary:     Effort: Pulmonary effort is normal. No respiratory distress.     Breath sounds: Normal breath sounds.  Abdominal:     Palpations: Abdomen is soft.     Tenderness: There  is no abdominal tenderness.  Musculoskeletal:     Cervical back: Neck supple.  Skin:    General: Skin is warm and dry.     Capillary Refill: Capillary refill takes less than 2 seconds.  Neurological:     Mental Status: She is alert and oriented to person, place, and time.  Psychiatric:        Mood and Affect: Mood normal.      ____________________________________________   LABS (all labs ordered are listed, but only abnormal results are displayed)  Labs Reviewed  CBC WITH DIFFERENTIAL/PLATELET - Abnormal; Notable for the following components:      Result Value   Hemoglobin 9.6 (*)    HCT 31.6 (*)    MCV 69.9 (*)    MCH 21.2 (*)    RDW 18.4 (*)    All other components within normal limits  BASIC METABOLIC PANEL   ____________________________________________  EKG  Junctional rhythm with a ventricular rate of 57, normal axis, unremarkable intervals without evidence of clear acute ischemia. ____________________________________________  RADIOLOGY  ED MD interpretation:    Official radiology report(s): No results found.  ____________________________________________   PROCEDURES  Procedure(s) performed (including Critical Care):  Procedures   ____________________________________________   INITIAL IMPRESSION / ASSESSMENT AND PLAN / ED COURSE      Patient presents with above-stated history exam for assessment of fatigue.  On arrival she is afebrile and hemodynamically stable slightly bradycardic.  She denies any chest pain and given otherwise reassuring EKG Evalose patient for ACS at this time.  She states her only concern is that her ferritin is quite low and she knows she is quite anemic from iron deficiency anemia secondary to menstrual blood losses and that she has had difficulty absorbing iron since her bypass surgery.  She denies any other associated sick symptoms including syncopal episodes, dizziness, headedness, cough, shortness of breath or any recent  injuries or falls.  She is not on blood thinners.  BMP today shows no  significant electrolyte or metabolic derangements.  CBC shows hemoglobin of 9.6 compared to 9.51-month ago but no leukocytosis and normal platelets.  Vies patient that she does not need a blood transfusion at this time she was quite relieved.  Do think she is stable for discharge with close outpatient PCP follow-up and she may require further outpatient iron transfusions but do not believe further emergency work-up or inpatient eval is indicated at this time.  Discharged stable condition.  Strict return precautions advised and discussed.        ____________________________________________   FINAL CLINICAL IMPRESSION(S) / ED DIAGNOSES  Final diagnoses:  Anemia, unspecified type  Fatigue, unspecified type    Medications - No data to display   ED Discharge Orders    None       Note:  This document was prepared using Dragon voice recognition software and may include unintentional dictation errors.   Lucrezia Starch, MD 04/21/21 1455

## 2021-04-23 ENCOUNTER — Telehealth: Payer: Self-pay | Admitting: Oncology

## 2021-04-23 ENCOUNTER — Other Ambulatory Visit: Payer: Self-pay

## 2021-04-23 DIAGNOSIS — E611 Iron deficiency: Secondary | ICD-10-CM

## 2021-04-23 NOTE — Telephone Encounter (Signed)
Got her scheduled--left her a VM and requested she return call to confirm.   Thanks, [employer]

## 2021-04-23 NOTE — Telephone Encounter (Signed)
Left VM with patient to notify her of appt scheduled (per inbasket message with NP/RN) for Labs/NP/Possible iron infusion on Thursday.

## 2021-04-25 ENCOUNTER — Inpatient Hospital Stay (HOSPITAL_BASED_OUTPATIENT_CLINIC_OR_DEPARTMENT_OTHER): Payer: 59 | Admitting: Nurse Practitioner

## 2021-04-25 ENCOUNTER — Inpatient Hospital Stay: Payer: 59

## 2021-04-25 ENCOUNTER — Encounter: Payer: Self-pay | Admitting: Nurse Practitioner

## 2021-04-25 ENCOUNTER — Telehealth: Payer: Self-pay

## 2021-04-25 ENCOUNTER — Inpatient Hospital Stay: Payer: 59 | Attending: Nurse Practitioner

## 2021-04-25 VITALS — BP 116/78 | HR 71 | Temp 98.4°F | Resp 17 | Wt 138.0 lb

## 2021-04-25 VITALS — BP 102/62 | HR 76 | Resp 18

## 2021-04-25 DIAGNOSIS — N920 Excessive and frequent menstruation with regular cycle: Secondary | ICD-10-CM | POA: Diagnosis not present

## 2021-04-25 DIAGNOSIS — D500 Iron deficiency anemia secondary to blood loss (chronic): Secondary | ICD-10-CM

## 2021-04-25 DIAGNOSIS — D509 Iron deficiency anemia, unspecified: Secondary | ICD-10-CM | POA: Insufficient documentation

## 2021-04-25 DIAGNOSIS — N924 Excessive bleeding in the premenopausal period: Secondary | ICD-10-CM | POA: Diagnosis not present

## 2021-04-25 DIAGNOSIS — Z3202 Encounter for pregnancy test, result negative: Secondary | ICD-10-CM | POA: Insufficient documentation

## 2021-04-25 DIAGNOSIS — E611 Iron deficiency: Secondary | ICD-10-CM

## 2021-04-25 DIAGNOSIS — Z9884 Bariatric surgery status: Secondary | ICD-10-CM | POA: Diagnosis not present

## 2021-04-25 LAB — CBC WITH DIFFERENTIAL/PLATELET
Abs Immature Granulocytes: 0.02 10*3/uL (ref 0.00–0.07)
Basophils Absolute: 0 10*3/uL (ref 0.0–0.1)
Basophils Relative: 1 %
Eosinophils Absolute: 0.1 10*3/uL (ref 0.0–0.5)
Eosinophils Relative: 2 %
HCT: 29 % — ABNORMAL LOW (ref 36.0–46.0)
Hemoglobin: 8.7 g/dL — ABNORMAL LOW (ref 12.0–15.0)
Immature Granulocytes: 0 %
Lymphocytes Relative: 35 %
Lymphs Abs: 2 10*3/uL (ref 0.7–4.0)
MCH: 21.3 pg — ABNORMAL LOW (ref 26.0–34.0)
MCHC: 30 g/dL (ref 30.0–36.0)
MCV: 70.9 fL — ABNORMAL LOW (ref 80.0–100.0)
Monocytes Absolute: 0.7 10*3/uL (ref 0.1–1.0)
Monocytes Relative: 12 %
Neutro Abs: 2.9 10*3/uL (ref 1.7–7.7)
Neutrophils Relative %: 50 %
Platelets: 274 10*3/uL (ref 150–400)
RBC: 4.09 MIL/uL (ref 3.87–5.11)
RDW: 18.6 % — ABNORMAL HIGH (ref 11.5–15.5)
WBC: 5.9 10*3/uL (ref 4.0–10.5)
nRBC: 0 % (ref 0.0–0.2)

## 2021-04-25 LAB — IRON AND TIBC
Iron: 35 ug/dL (ref 28–170)
Saturation Ratios: 7 % — ABNORMAL LOW (ref 10.4–31.8)
TIBC: 538 ug/dL — ABNORMAL HIGH (ref 250–450)
UIBC: 503 ug/dL

## 2021-04-25 LAB — FERRITIN: Ferritin: 3 ng/mL — ABNORMAL LOW (ref 11–307)

## 2021-04-25 MED ORDER — SODIUM CHLORIDE 0.9 % IV SOLN: 200.0000 mg | Freq: Once | INTRAVENOUS | Status: DC

## 2021-04-25 MED ORDER — SODIUM CHLORIDE 0.9 % IV SOLN
Freq: Once | INTRAVENOUS | Status: AC
  Filled 2021-04-25: qty 250

## 2021-04-25 MED ORDER — IRON SUCROSE 20 MG/ML IV SOLN
200.0000 mg | Freq: Once | INTRAVENOUS | Status: AC
Start: 2021-04-25 — End: 2021-04-25
  Administered 2021-04-25: 200 mg via INTRAVENOUS
  Filled 2021-04-25: qty 10

## 2021-04-25 NOTE — Patient Instructions (Signed)

## 2021-04-25 NOTE — Progress Notes (Signed)
Patient here for oncology follow-up appointment, expresses concerns of fatigue, no energy

## 2021-04-25 NOTE — Progress Notes (Signed)
Hematology/Oncology Progress Note [facility] [phone] [phone]   Patient Care Team: Gayland Curry, MD as PCP - General (Family Medicine)  REFERRING PROVIDER: Gayland Curry, MD  CHIEF COMPLAINTS/REASON FOR VISIT:  Iron deficiency anemia  HISTORY OF PRESENTING ILLNESS:  Tammy Todd is a  40 y.o.  female with PMH listed below was seen in consultation at the request of Gayland Curry, MD for evaluation of iron deficiency anemia secondary to menorrhagia, gastric bypass, and possible malabsorption.  Associated signs and symptoms: Patient reports fatigue.  SOB with exertion.  Denies weight loss, easy bruising, hematochezia, hemoptysis, hematuria. Context:  History of iron deficiency: Longstanding history of iron deficiency.  Patient takes oral iron supplementation ferrous sulfate 325 mg twice daily. Rectal bleeding: Denies Menstrual bleeding/ Vaginal bleeding : Heavy menstrual bleeding Hematemesis or hemoptysis : denies Blood in urine : denies  History of bariatric surgery.  She received IV iron x 3 and was lost to follow up.      Interval History: Patient returns to clinic for re-evaluation and consideration of IV iron. She complains of fatigue, shortness of breath with exertion. She complains of tiredness that doesn't improve with rest. No pica. Describes heavy menstrual periods. Not currently on birth control. Taking oral iron. History of gastric bypass. History of difficult IV access; declined picc line or port-a-cath. Denies melena or hematochezia. Denies hematemesis or hemoptysis. Denies hematuria. Denies unintentional weight loss, easy bruising. Hx of bariatric surgery.   Review of Systems  Constitutional:  Positive for fatigue. Negative for appetite change, chills and fever.  HENT:   Negative for hearing loss and voice change.   Eyes:  Negative for eye problems.  Respiratory:  Negative for chest tightness and cough.    Cardiovascular:  Negative for chest pain.  Gastrointestinal:  Negative for abdominal distention, abdominal pain and blood in stool.  Endocrine: Negative for hot flashes.  Genitourinary:  Positive for menstrual problem. Negative for difficulty urinating and frequency.   Musculoskeletal:  Negative for arthralgias.  Skin:  Negative for itching and rash.  Neurological:  Negative for extremity weakness.  Hematological:  Negative for adenopathy.  Psychiatric/Behavioral:  Negative for confusion.    MEDICAL HISTORY:  Past Medical History:  Diagnosis Date   Anemia    Anxiety    Depression    GERD (gastroesophageal reflux disease)    H. pylori infection    Hiatal hernia    Scoliosis     SURGICAL HISTORY: Past Surgical History:  Procedure Laterality Date   BARIATRIC SURGERY     COLONOSCOPY WITH PROPOFOL N/A 09/22/2019   Procedure: COLONOSCOPY WITH PROPOFOL;  Surgeon: Toledo, Benay Pike, MD;  Location: [facility];  Service: Endoscopy;  Laterality: N/A;   ESOPHAGOGASTRODUODENOSCOPY (EGD) WITH PROPOFOL N/A 09/22/2019   Procedure: ESOPHAGOGASTRODUODENOSCOPY (EGD) WITH PROPOFOL;  Surgeon: Toledo, Benay Pike, MD;  Location: [facility];  Service: Endoscopy;  Laterality: N/A;    SOCIAL HISTORY: Social History   Socioeconomic History   Marital status: Married    Spouse name: Not on file   Number of children: Not on file   Years of education: Not on file   Highest education level: Not on file  Occupational History   Not on file  Tobacco Use   Smoking status: Former    Years: 9.00    Pack years: 0.00    Types: Cigarettes    Quit date: 12/2009    Years since quitting: 11.3   Smokeless tobacco: Never  Vaping Use  Vaping Use: Never used  Substance and Sexual Activity   Alcohol use: Yes    Comment: social   Drug use: No   Sexual activity: Yes  Other Topics Concern   Not on file  Social History Narrative   Not on file   Social Determinants of Health   Financial Resource  Strain: Not on file  Food Insecurity: Not on file  Transportation Needs: Not on file  Physical Activity: Not on file  Stress: Not on file  Social Connections: Not on file  Intimate Partner Violence: Not on file  She works as a Engineer, 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   FAMILY HISTORY: Family History  Problem Relation Age of Onset   Diabetes Mother    Hyperlipidemia Mother    Hypertension Mother    Alcohol abuse Father    Hypertension Father    Obesity Father    Hyperlipidemia Father    Diabetes Father    Depression Sister    Anxiety disorder Sister    Diabetes Sister    Diabetes Brother    Hyperlipidemia Brother    Hypertension Brother    Obesity Paternal Grandmother    Breast cancer Paternal Aunt    Stomach cancer Paternal Uncle     ALLERGIES:  is allergic to cashew nut oil, penicillins, and pistachio nut extract skin test.  MEDICATIONS:  Current Outpatient Medications  Medication Sig Dispense Refill   Ascorbic Acid (VITAMIN C) 100 MG tablet Take by mouth.     Calcium Carbonate (CALCIUM 500 PO) Take 1 tablet by mouth 4 (four) times daily.     ergocalciferol (VITAMIN D2) 1.25 MG (50000 UT) capsule Take 50,000 Units by mouth once a week.     ferrous fumarate (HEMOCYTE - 106 MG FE) 325 (106 FE) MG TABS tablet Take 1 tablet by mouth 2 (two) times daily.     ferrous sulfate 325 (65 FE) MG tablet Take 1 tablet (325 mg total) by mouth 2 (two) times daily with a meal. 60 tablet 2   gabapentin (NEURONTIN) 300 MG capsule Take by mouth.     Multiple Vitamins-Minerals (MULTIVITAMIN ADULT EXTRA C) CHEW Chew 1 tablet by mouth daily.     No current facility-administered medications for this visit.     PHYSICAL EXAMINATION: ECOG PERFORMANCE STATUS: 1 - Symptomatic but completely ambulatory Vitals:   04/25/21 1334  BP: 116/78  Pulse: 71  Resp: 17  Temp: 98.4 F (36.9 C)  SpO2: 100%   Filed Weights   04/25/21 1334  Weight: 138 lb (62.6 kg)    Physical  Exam Constitutional:      General: She is not in acute distress. HENT:     Head: Normocephalic and atraumatic.  Eyes:     General: No scleral icterus. Cardiovascular:     Rate and Rhythm: Normal rate and regular rhythm.     Heart sounds: Normal heart sounds.  Pulmonary:     Effort: Pulmonary effort is normal. No respiratory distress.     Breath sounds: No wheezing.  Abdominal:     General: Bowel sounds are normal. There is no distension.     Palpations: Abdomen is soft.  Musculoskeletal:        General: No deformity. Normal range of motion.     Cervical back: Normal range of motion and neck supple.  Skin:    General: Skin is warm and dry.     Findings: No erythema or rash.  Neurological:     Mental Status:  She is alert and oriented to person, place, and time. Mental status is at baseline.     Cranial Nerves: No cranial nerve deficit.     Coordination: Coordination normal.  Psychiatric:        Mood and Affect: Mood normal.     CBC EXTENDED Latest Ref Rng & Units 04/25/2021 04/21/2021 11/28/2020  WBC 4.0 - 10.5 K/uL 5.9 8.3 7.0  RBC 3.87 - 5.11 MIL/uL 4.09 4.52 4.29  HGB 12.0 - 15.0 g/dL 8.7(L) 9.6(L) 9.6(L)  HCT 36.0 - 46.0 % 29.0(L) 31.6(L) 32.5(L)  PLT 150 - 400 K/uL 274 296 341  NEUTROABS 1.7 - 7.7 K/uL 2.9 5.6 -  LYMPHSABS 0.7 - 4.0 K/uL 2.0 1.9 -   CMP Latest Ref Rng & Units 04/21/2021 11/28/2020 11/15/2019  Glucose 70 - 99 mg/dL 90 92 101(H)  BUN 6 - 20 mg/dL 16 10 7   Creatinine 0.44 - 1.00 mg/dL 0.79 0.68 0.69  Sodium 135 - 145 mmol/L 137 139 138  Potassium 3.5 - 5.1 mmol/L 4.1 3.9 3.1(L)  Chloride 98 - 111 mmol/L 108 105 105  CO2 22 - 32 mmol/L 22 24 17(L)  Calcium 8.9 - 10.3 mg/dL 8.9 9.0 9.5  Total Protein 6.5 - 8.1 g/dL - - 8.1  Total Bilirubin 0.3 - 1.2 mg/dL - - 0.9  Alkaline Phos 38 - 126 U/L - - 66  AST 15 - 41 U/L - - 20  ALT 0 - 44 U/L - - 15   Iron/TIBC/Ferritin/ %Sat    Component Value Date/Time   IRON 35 04/25/2021 1307   TIBC 538 (H) 04/25/2021  1307   FERRITIN 3 (L) 04/25/2021 1307   IRONPCTSAT 7 (L) 04/25/2021 1307   RADIOGRAPHIC STUDIES: I have personally reviewed the radiological images as listed and agreed with the findings in the report. No results found.    ASSESSMENT & PLAN: Patient is 40 year old female who returns to clinic for further evaluation and management of 1.  Iron deficiency anemia- secondary to heavy menstrual periods and likely gastric bypass. Labs today consistent with iron deficiency. Hemoglobin 8.7, ferritin 3, iron sat 7%, TIBC 538. Plan for IV iron with venofer 200 mg weekly x 6 doses given that patient needs rapid replenishment of iron stores. Continue oral iron. Suspect malabsorption secondary to gastric bypass. Again reviewed risks of venofer including allergic reactions, infusion reacti, possible anaphylaxis, high blood pressure, rash, weight gain, swelling. Patient voices understanding with teach back and wishes to proceed.   2. Menorrhagia- Will refer to gynecology for evaluation and management of heavy menstrual periods. Patient requests female provider at [facility]; Will refer to Dr. Marcelline Mates. Discuss surgical and nonsurgical options.   3. Gastric bypass- continue vitamin replacmeent per bariatric surgery. Plan to check b12 at next visit.   4. Poor venous access- encouraged hydration. May need to consider port in the future. Reviewed rationale for procedure. Patient reluctant and will consider if needed.   Plan:  Venofer today and weekly for total of 6 doses Check urine pregnancy monthly RTC in 8 weeks for lab, MD/NP, possible venofer   1. Iron deficiency anemia due to chronic blood loss   2. Menorrhagia, premenopausal     Orders Placed This Encounter  Procedures   CBC    Standing Status:   Future    Standing Expiration Date:   04/25/2022   Comprehensive metabolic panel    Standing Status:   Future    Standing Expiration Date:   04/25/2022   Ferritin  Standing Status:   Future    Standing  Expiration Date:   04/25/2022   Iron and TIBC    Standing Status:   Future    Standing Expiration Date:   04/25/2022   Vitamin B12    Standing Status:   Future    Standing Expiration Date:   04/25/2022   Folate    Standing Status:   Future    Standing Expiration Date:   04/25/2022   Pregnancy, urine    Standing Status:   Standing    Number of Occurrences:   12    Standing Expiration Date:   04/25/2022   Ambulatory referral to Gynecology    Referral Priority:   Routine    Referral Type:   Consultation    Referral Reason:   Specialty Services Required    Requested Specialty:   Gynecology    Number of Visits Requested:   1    All questions were answered. The patient knows to call the clinic with any problems questions or concerns.  Thank you for the opportunity to participate in the care of this very pleasant patient.  Beckey Rutter, DNP, AGNP-C [place] [facility] [phone] (clinic)  CC: PCP

## 2021-04-26 ENCOUNTER — Other Ambulatory Visit: Payer: Self-pay

## 2021-04-26 DIAGNOSIS — E611 Iron deficiency: Secondary | ICD-10-CM

## 2021-04-29 ENCOUNTER — Encounter: Payer: Self-pay | Admitting: Oncology

## 2021-05-02 ENCOUNTER — Other Ambulatory Visit: Payer: Self-pay

## 2021-05-02 ENCOUNTER — Inpatient Hospital Stay: Payer: 59

## 2021-05-02 VITALS — BP 115/74 | HR 63 | Temp 96.6°F | Resp 18

## 2021-05-02 DIAGNOSIS — E611 Iron deficiency: Secondary | ICD-10-CM

## 2021-05-02 DIAGNOSIS — D509 Iron deficiency anemia, unspecified: Secondary | ICD-10-CM | POA: Diagnosis not present

## 2021-05-02 LAB — PREGNANCY, URINE: Preg Test, Ur: NEGATIVE

## 2021-05-02 MED ORDER — SODIUM CHLORIDE 0.9 % IV SOLN: 200.0000 mg | Freq: Once | INTRAVENOUS | Status: DC

## 2021-05-02 MED ORDER — SODIUM CHLORIDE 0.9 % IV SOLN
Freq: Once | INTRAVENOUS | Status: AC
Start: 2021-05-02 — End: 2021-05-02
  Filled 2021-05-02: qty 250

## 2021-05-02 MED ORDER — IRON SUCROSE 20 MG/ML IV SOLN
200.0000 mg | Freq: Once | INTRAVENOUS | Status: AC
  Administered 2021-05-02: 200 mg via INTRAVENOUS
  Filled 2021-05-02: qty 10

## 2021-05-02 NOTE — Patient Instructions (Signed)
[facility] [place] [facility]   Discharge Instructions: Thank you for choosing [facility] to provide your oncology and hematology care.  If you have a lab appointment with the Cancer Center, please go directly to the Cancer Center and check in at the registration area.  We strive to give you quality time with your provider. You may need to reschedule your appointment if you arrive late (15 or more minutes).  Arriving late affects you and other patients whose appointments are after yours.  Also, if you miss three or more appointments without notifying the office, you may be dismissed from the clinic at the provider's discretion.      For prescription refill requests, have your pharmacy contact our office and allow 72 hours for refills to be completed.    Today you received the following: Venofer.      BELOW ARE SYMPTOMS THAT SHOULD BE REPORTED IMMEDIATELY: *FEVER GREATER THAN 100.4 F (38 C) OR HIGHER *CHILLS OR SWEATING *NAUSEA AND VOMITING THAT IS NOT CONTROLLED WITH YOUR NAUSEA MEDICATION *UNUSUAL SHORTNESS OF BREATH *UNUSUAL BRUISING OR BLEEDING *URINARY PROBLEMS (pain or burning when urinating, or frequent urination) *BOWEL PROBLEMS (unusual diarrhea, constipation, pain near the anus) TENDERNESS IN MOUTH AND THROAT WITH OR WITHOUT PRESENCE OF ULCERS (sore throat, sores in mouth, or a toothache) UNUSUAL RASH, SWELLING OR PAIN  UNUSUAL VAGINAL DISCHARGE OR ITCHING   Items with * indicate a potential emergency and should be followed up as soon as possible or go to the Emergency Department if any problems should occur.  Should you have questions after your visit or need to cancel or reschedule your appointment, please contact [facility] [place] [facility]  [phone] and follow the prompts.  Office hours are 8:00 a.m. to 4:30 p.m. Monday - Friday. Please note that voicemails left after 4:00 p.m. may not be returned until the following  business day.  We are closed weekends and major holidays. You have access to a nurse at all times for urgent questions. Please call the main number to the clinic [phone] and follow the prompts.  For any non-urgent questions, you may also contact your provider using MyChart. We now offer e-Visits for anyone 18 and older to request care online for non-urgent symptoms. For details visit [email or web address].   Also download the MyChart app! Go to the app store, search "MyChart", open the app, select [facility], and log in with your MyChart username and password.  Due to Covid, a mask is required upon entering the hospital/clinic. If you do not have a mask, one will be given to you upon arrival. For doctor visits, patients may have 1 support person aged 18 or older with them. For treatment visits, patients cannot have anyone with them due to current Covid guidelines and our immunocompromised population.  

## 2021-05-06 ENCOUNTER — Encounter: Payer: Self-pay | Admitting: Nurse Practitioner

## 2021-05-09 ENCOUNTER — Inpatient Hospital Stay: Payer: 59

## 2021-05-09 VITALS — BP 112/63 | HR 59 | Temp 96.0°F | Resp 18

## 2021-05-09 DIAGNOSIS — E611 Iron deficiency: Secondary | ICD-10-CM

## 2021-05-09 DIAGNOSIS — D509 Iron deficiency anemia, unspecified: Secondary | ICD-10-CM | POA: Diagnosis not present

## 2021-05-09 MED ORDER — SODIUM CHLORIDE 0.9 % IV SOLN: 200.0000 mg | Freq: Once | INTRAVENOUS | Status: DC

## 2021-05-09 MED ORDER — SODIUM CHLORIDE 0.9 % IV SOLN
Freq: Once | INTRAVENOUS | Status: AC
  Filled 2021-05-09: qty 250

## 2021-05-09 MED ORDER — IRON SUCROSE 20 MG/ML IV SOLN
200.0000 mg | Freq: Once | INTRAVENOUS | Status: AC
  Administered 2021-05-09: 200 mg via INTRAVENOUS
  Filled 2021-05-09: qty 10

## 2021-05-09 NOTE — Patient Instructions (Signed)
[facility] [place] [facility]  Discharge Instructions: Thank you for choosing [facility] to provide your oncology and hematology care.  If you have a lab appointment with the Cancer Center, please go directly to the Cancer Center and check in at the registration area.  Wear comfortable clothing and clothing appropriate for easy access to any Portacath or PICC line.   We strive to give you quality time with your provider. You may need to reschedule your appointment if you arrive late (15 or more minutes).  Arriving late affects you and other patients whose appointments are after yours.  Also, if you miss three or more appointments without notifying the office, you may be dismissed from the clinic at the provider's discretion.      For prescription refill requests, have your pharmacy contact our office and allow 72 hours for refills to be completed.    Today you received venofer   To help prevent nausea and vomiting after your treatment, we encourage you to take your nausea medication as directed.  BELOW ARE SYMPTOMS THAT SHOULD BE REPORTED IMMEDIATELY: *FEVER GREATER THAN 100.4 F (38 C) OR HIGHER *CHILLS OR SWEATING *NAUSEA AND VOMITING THAT IS NOT CONTROLLED WITH YOUR NAUSEA MEDICATION *UNUSUAL SHORTNESS OF BREATH *UNUSUAL BRUISING OR BLEEDING *URINARY PROBLEMS (pain or burning when urinating, or frequent urination) *BOWEL PROBLEMS (unusual diarrhea, constipation, pain near the anus) TENDERNESS IN MOUTH AND THROAT WITH OR WITHOUT PRESENCE OF ULCERS (sore throat, sores in mouth, or a toothache) UNUSUAL RASH, SWELLING OR PAIN  UNUSUAL VAGINAL DISCHARGE OR ITCHING   Items with * indicate a potential emergency and should be followed up as soon as possible or go to the Emergency Department if any problems should occur.  Please show the CHEMOTHERAPY ALERT CARD or IMMUNOTHERAPY ALERT CARD at check-in to the Emergency Department and triage nurse.  Should you  have questions after your visit or need to cancel or reschedule your appointment, please contact [facility] [place] [facility]  [phone] and follow the prompts.  Office hours are 8:00 a.m. to 4:30 p.m. Monday - Friday. Please note that voicemails left after 4:00 p.m. may not be returned until the following business day.  We are closed weekends and major holidays. You have access to a nurse at all times for urgent questions. Please call the main number to the clinic [phone] and follow the prompts.  For any non-urgent questions, you may also contact your provider using MyChart. We now offer e-Visits for anyone 18 and older to request care online for non-urgent symptoms. For details visit [email or web address].   Also download the MyChart app! Go to the app store, search "MyChart", open the app, select [facility], and log in with your MyChart username and password.  Due to Covid, a mask is required upon entering the hospital/clinic. If you do not have a mask, one will be given to you upon arrival. For doctor visits, patients may have 1 support person aged 18 or older with them. For treatment visits, patients cannot have anyone with them due to current Covid guidelines and our immunocompromised population.  

## 2021-05-16 ENCOUNTER — Inpatient Hospital Stay: Payer: 59

## 2021-05-16 ENCOUNTER — Other Ambulatory Visit: Payer: Self-pay

## 2021-05-16 VITALS — BP 112/74 | HR 67 | Temp 98.3°F | Resp 20

## 2021-05-16 DIAGNOSIS — E611 Iron deficiency: Secondary | ICD-10-CM

## 2021-05-16 DIAGNOSIS — D509 Iron deficiency anemia, unspecified: Secondary | ICD-10-CM | POA: Diagnosis not present

## 2021-05-16 MED ORDER — SODIUM CHLORIDE 0.9 % IV SOLN: 200.0000 mg | Freq: Once | INTRAVENOUS | Status: DC

## 2021-05-16 MED ORDER — SODIUM CHLORIDE 0.9 % IV SOLN
INTRAVENOUS | Status: DC
  Filled 2021-05-16: qty 250

## 2021-05-16 MED ORDER — IRON SUCROSE 20 MG/ML IV SOLN
200.0000 mg | Freq: Once | INTRAVENOUS | Status: AC
  Administered 2021-05-16: 200 mg via INTRAVENOUS
  Filled 2021-05-16: qty 10

## 2021-05-16 NOTE — Patient Instructions (Signed)

## 2021-05-17 ENCOUNTER — Telehealth: Payer: Self-pay | Admitting: Oncology

## 2021-05-17 NOTE — Telephone Encounter (Signed)
Left VM with patient to notify her of slight change in upcoming appt. Per NP inbasket--patient needs to see the MD at next visit.

## 2021-05-23 ENCOUNTER — Inpatient Hospital Stay: Payer: 59

## 2021-05-23 ENCOUNTER — Inpatient Hospital Stay: Payer: 59 | Attending: Nurse Practitioner

## 2021-05-23 VITALS — BP 104/67 | HR 57 | Temp 96.9°F

## 2021-05-23 DIAGNOSIS — D509 Iron deficiency anemia, unspecified: Secondary | ICD-10-CM | POA: Insufficient documentation

## 2021-05-23 DIAGNOSIS — E611 Iron deficiency: Secondary | ICD-10-CM

## 2021-05-23 MED ORDER — SODIUM CHLORIDE 0.9 % IV SOLN
INTRAVENOUS | Status: DC
  Filled 2021-05-23: qty 250

## 2021-05-23 MED ORDER — SODIUM CHLORIDE 0.9 % IV SOLN: 200.0000 mg | Freq: Once | INTRAVENOUS | Status: DC

## 2021-05-23 MED ORDER — IRON SUCROSE 20 MG/ML IV SOLN
200.0000 mg | Freq: Once | INTRAVENOUS | Status: AC
Start: 2021-05-23 — End: 2021-05-23
  Administered 2021-05-23: 200 mg via INTRAVENOUS
  Filled 2021-05-23: qty 10

## 2021-05-23 NOTE — Patient Instructions (Signed)
[facility] [place] [facility]  Discharge Instructions: Thank you for choosing [facility] to provide your oncology and hematology care.  If you have a lab appointment with the Cancer Center, please go directly to the Cancer Center and check in at the registration area.  Wear comfortable clothing and clothing appropriate for easy access to any Portacath or PICC line.   We strive to give you quality time with your provider. You may need to reschedule your appointment if you arrive late (15 or more minutes).  Arriving late affects you and other patients whose appointments are after yours.  Also, if you miss three or more appointments without notifying the office, you may be dismissed from the clinic at the provider's discretion.      For prescription refill requests, have your pharmacy contact our office and allow 72 hours for refills to be completed.    Today you received the following : Venofer   To help prevent nausea and vomiting after your treatment, we encourage you to take your nausea medication as directed.  BELOW ARE SYMPTOMS THAT SHOULD BE REPORTED IMMEDIATELY: . *FEVER GREATER THAN 100.4 F (38 C) OR HIGHER . *CHILLS OR SWEATING . *NAUSEA AND VOMITING THAT IS NOT CONTROLLED WITH YOUR NAUSEA MEDICATION . *UNUSUAL SHORTNESS OF BREATH . *UNUSUAL BRUISING OR BLEEDING . *URINARY PROBLEMS (pain or burning when urinating, or frequent urination) . *BOWEL PROBLEMS (unusual diarrhea, constipation, pain near the anus) . TENDERNESS IN MOUTH AND THROAT WITH OR WITHOUT PRESENCE OF ULCERS (sore throat, sores in mouth, or a toothache) . UNUSUAL RASH, SWELLING OR PAIN  . UNUSUAL VAGINAL DISCHARGE OR ITCHING   Items with * indicate a potential emergency and should be followed up as soon as possible or go to the Emergency Department if any problems should occur.  Please show the CHEMOTHERAPY ALERT CARD or IMMUNOTHERAPY ALERT CARD at check-in to the Emergency  Department and triage nurse.  Should you have questions after your visit or need to cancel or reschedule your appointment, please contact [facility] [place] [facility]  [phone] and follow the prompts.  Office hours are 8:00 a.m. to 4:30 p.m. Monday - Friday. Please note that voicemails left after 4:00 p.m. may not be returned until the following business day.  We are closed weekends and major holidays. You have access to a nurse at all times for urgent questions. Please call the main number to the clinic [phone] and follow the prompts.  For any non-urgent questions, you may also contact your provider using MyChart. We now offer e-Visits for anyone 18 and older to request care online for non-urgent symptoms. For details visit [email or web address].   Also download the MyChart app! Go to the app store, search "MyChart", open the app, select [facility] [place], and log in with your MyChart username and password.  Due to Covid, a mask is required upon entering the hospital/clinic. If you do not have a mask, one will be given to you upon arrival. For doctor visits, patients may have 1 support person aged 18 or older with them. For treatment visits, patients cannot have anyone with them due to current Covid guidelines and our immunocompromised population.  

## 2021-05-30 ENCOUNTER — Other Ambulatory Visit: Payer: Self-pay

## 2021-05-30 ENCOUNTER — Inpatient Hospital Stay: Payer: 59

## 2021-05-30 VITALS — BP 100/70 | HR 68 | Temp 96.6°F | Resp 20

## 2021-05-30 DIAGNOSIS — E611 Iron deficiency: Secondary | ICD-10-CM

## 2021-05-30 DIAGNOSIS — D509 Iron deficiency anemia, unspecified: Secondary | ICD-10-CM | POA: Diagnosis not present

## 2021-05-30 MED ORDER — IRON SUCROSE 20 MG/ML IV SOLN
200.0000 mg | Freq: Once | INTRAVENOUS | Status: AC
  Administered 2021-05-30: 200 mg via INTRAVENOUS
  Filled 2021-05-30: qty 10

## 2021-05-30 MED ORDER — SODIUM CHLORIDE 0.9 % IV SOLN: 200.0000 mg | Freq: Once | INTRAVENOUS | Status: DC

## 2021-05-30 MED ORDER — SODIUM CHLORIDE 0.9 % IV SOLN
INTRAVENOUS | Status: DC
  Filled 2021-05-30: qty 250

## 2021-05-30 NOTE — Patient Instructions (Signed)

## 2021-06-06 ENCOUNTER — Inpatient Hospital Stay: Payer: 59

## 2021-06-06 ENCOUNTER — Other Ambulatory Visit: Payer: Self-pay | Admitting: Oncology

## 2021-06-06 ENCOUNTER — Other Ambulatory Visit: Payer: Self-pay

## 2021-06-06 VITALS — BP 108/69 | HR 63

## 2021-06-06 DIAGNOSIS — D509 Iron deficiency anemia, unspecified: Secondary | ICD-10-CM | POA: Diagnosis not present

## 2021-06-06 DIAGNOSIS — D500 Iron deficiency anemia secondary to blood loss (chronic): Secondary | ICD-10-CM

## 2021-06-06 MED ORDER — SODIUM CHLORIDE 0.9 % IV SOLN
INTRAVENOUS | Status: DC
  Filled 2021-06-06: qty 250

## 2021-06-06 NOTE — Progress Notes (Signed)
Pt arrived for venofer infusion. Pt has already received venofer x 6 . Per MD - hold venofer today- keep appt in august for labs/ MD / possible venofer. Pt aware and verbalizes understanding.

## 2021-06-07 NOTE — Telephone Encounter (Signed)
Can you change her lab appt back to 8/5 please. Per pt request due to work schedule.

## 2021-06-07 NOTE — Telephone Encounter (Signed)
Patient rescheduled. Send her MyChart message.

## 2021-06-19 ENCOUNTER — Encounter: Payer: Self-pay | Admitting: Oncology

## 2021-06-20 ENCOUNTER — Other Ambulatory Visit: Payer: 59

## 2021-06-20 ENCOUNTER — Ambulatory Visit: Payer: 59 | Admitting: Nurse Practitioner

## 2021-06-21 ENCOUNTER — Inpatient Hospital Stay: Payer: Self-pay

## 2021-06-21 ENCOUNTER — Inpatient Hospital Stay: Payer: Self-pay | Attending: Nurse Practitioner | Admitting: Oncology

## 2021-06-21 ENCOUNTER — Encounter: Payer: Self-pay | Admitting: Oncology

## 2021-06-21 ENCOUNTER — Other Ambulatory Visit: Payer: 59

## 2021-06-21 VITALS — BP 108/68 | HR 72 | Temp 97.4°F | Resp 16 | Wt 133.8 lb

## 2021-06-21 DIAGNOSIS — D500 Iron deficiency anemia secondary to blood loss (chronic): Secondary | ICD-10-CM

## 2021-06-21 DIAGNOSIS — D509 Iron deficiency anemia, unspecified: Secondary | ICD-10-CM | POA: Insufficient documentation

## 2021-06-21 DIAGNOSIS — N920 Excessive and frequent menstruation with regular cycle: Secondary | ICD-10-CM | POA: Insufficient documentation

## 2021-06-21 DIAGNOSIS — N924 Excessive bleeding in the premenopausal period: Secondary | ICD-10-CM | POA: Insufficient documentation

## 2021-06-21 DIAGNOSIS — Z9884 Bariatric surgery status: Secondary | ICD-10-CM | POA: Insufficient documentation

## 2021-06-21 LAB — COMPREHENSIVE METABOLIC PANEL WITH GFR
ALT: 15 U/L (ref 0–44)
AST: 21 U/L (ref 15–41)
Albumin: 4.2 g/dL (ref 3.5–5.0)
Alkaline Phosphatase: 73 U/L (ref 38–126)
Anion gap: 6 (ref 5–15)
BUN: 13 mg/dL (ref 6–20)
CO2: 23 mmol/L (ref 22–32)
Calcium: 8.6 mg/dL — ABNORMAL LOW (ref 8.9–10.3)
Chloride: 109 mmol/L (ref 98–111)
Creatinine, Ser: 0.63 mg/dL (ref 0.44–1.00)
GFR, Estimated: >60 mL/min
Glucose, Bld: 87 mg/dL (ref 70–99)
Potassium: 3.9 mmol/L (ref 3.5–5.1)
Sodium: 138 mmol/L (ref 135–145)
Total Bilirubin: 0.5 mg/dL (ref 0.3–1.2)
Total Protein: 6.8 g/dL (ref 6.5–8.1)

## 2021-06-21 LAB — CBC
HCT: 37.1 % (ref 36.0–46.0)
Hemoglobin: 11.4 g/dL — ABNORMAL LOW (ref 12.0–15.0)
MCH: 26.5 pg (ref 26.0–34.0)
MCHC: 30.7 g/dL (ref 30.0–36.0)
MCV: 86.3 fL (ref 80.0–100.0)
Platelets: 210 K/uL (ref 150–400)
RBC: 4.3 MIL/uL (ref 3.87–5.11)
WBC: 4.4 K/uL (ref 4.0–10.5)
nRBC: 0 % (ref 0.0–0.2)

## 2021-06-21 LAB — IRON AND TIBC
Iron: 85 ug/dL (ref 28–170)
Saturation Ratios: 21 % (ref 10.4–31.8)
TIBC: 409 ug/dL (ref 250–450)
UIBC: 324 ug/dL

## 2021-06-21 LAB — FERRITIN: Ferritin: 79 ng/mL (ref 11–307)

## 2021-06-21 LAB — FOLATE: Folate: 21 ng/mL (ref >5.9)

## 2021-06-21 LAB — VITAMIN B12: Vitamin B-12: 832 pg/mL (ref 180–914)

## 2021-06-21 NOTE — Progress Notes (Signed)
Patient has a slight improvement in energy since receiving IV iron.

## 2021-06-21 NOTE — Progress Notes (Signed)
Hematology/Oncology progress  note [facility] [phone] [phone]   Patient Care Team: Gayland Curry, MD as PCP - General (Family Medicine)  REFERRING PROVIDER: Gayland Curry, MD CHIEF COMPLAINTS/REASON FOR VISIT:  Evaluation of iron deficiency anemia  HISTORY OF PRESENTING ILLNESS:  Tammy Todd is a  40 y.o.  female with PMH listed below was seen in consultation at the request of Gayland Curry, MD   for evaluation of iron deficiency anemia.   Reviewed patient's recent labs  05/07/2020 [facility] CBC showed hemoglobin 10.7, MCV 73, RDW 16.5, WBC 6.9, normal differential platelet count 427,000.  Creatinine 0.88 sodium 141, potassium 4.2, calcium 9.2, albumin 4.3, bilirubin 1.3, copper 640 MCG/l, folate 14.3, vitamin D 25 hydroxy 38.3, vitamin K one 0.05 vitamin B12 more than 2000, zinc 85, ferritin 5, iron 37 Reviewed patient's previous labs ordered by primary care physician's office, anemia is chronic onset  No aggravating or improving factors.  Associated signs and symptoms: Patient reports fatigue.  SOB with exertion.  Denies weight loss, easy bruising, hematochezia, hemoptysis, hematuria. Context:  History of iron deficiency: Longstanding history of iron deficiency.  Patient takes oral iron supplementation ferrous sulfate 325 mg twice daily. Rectal bleeding: Denies Menstrual bleeding/ Vaginal bleeding : Heavy menstrual bleeding Hematemesis or hemoptysis : denies Blood in urine : denies  History of bariatric surgery.  INTERVAL HISTORY Tammy Todd is a 40 y.o. female who has above history reviewed by me today presents for follow up visit for management of iron deficiency anemia Patient was seen by nurse practitioner 6 weeks ago, status post IV Venofer treatments.  She reports fatigue level has improved.  Continue to have menorrhagia.   Review of Systems  Constitutional:  Positive for fatigue.  Negative for appetite change, chills and fever.  HENT:   Negative for hearing loss and voice change.   Eyes:  Negative for eye problems.  Respiratory:  Negative for chest tightness and cough.   Cardiovascular:  Negative for chest pain.  Gastrointestinal:  Negative for abdominal distention, abdominal pain and blood in stool.  Endocrine: Negative for hot flashes.  Genitourinary:  Positive for menstrual problem. Negative for difficulty urinating and frequency.   Musculoskeletal:  Negative for arthralgias.  Skin:  Negative for itching and rash.  Neurological:  Negative for extremity weakness.  Hematological:  Negative for adenopathy.  Psychiatric/Behavioral:  Negative for confusion.    MEDICAL HISTORY:  Past Medical History:  Diagnosis Date   Abnormal weight gain 11/03/2018   Anemia    Anxiety    Class 3 severe obesity due to excess calories with serious comorbidity and body mass index (BMI) of 45.0 to 49.9 in adult ([place]) 11/03/2018   Depression    GERD (gastroesophageal reflux disease)    H. pylori infection    Hiatal hernia    Iron deficiency 04/03/2015   Scoliosis     SURGICAL HISTORY: Past Surgical History:  Procedure Laterality Date   BARIATRIC SURGERY     COLONOSCOPY WITH PROPOFOL N/A 09/22/2019   Procedure: COLONOSCOPY WITH PROPOFOL;  Surgeon: Toledo, Benay Pike, MD;  Location: [facility];  Service: Endoscopy;  Laterality: N/A;   ESOPHAGOGASTRODUODENOSCOPY (EGD) WITH PROPOFOL N/A 09/22/2019   Procedure: ESOPHAGOGASTRODUODENOSCOPY (EGD) WITH PROPOFOL;  Surgeon: Toledo, Benay Pike, MD;  Location: [facility] ENDOSCOPY;  Service: Endoscopy;  Laterality: N/A;    SOCIAL HISTORY: Social History   Socioeconomic History   Marital status: Married    Spouse name: Not on file   Number  of children: Not on file   Years of education: Not on file   Highest education level: Not on file  Occupational History   Not on file  Tobacco Use   Smoking status: Former    Years: 9.00    Types:  Cigarettes    Quit date: 12/2009    Years since quitting: 11.5   Smokeless tobacco: Never  Vaping Use   Vaping Use: Never used  Substance and Sexual Activity   Alcohol use: Yes    Comment: social   Drug use: No   Sexual activity: Yes  Other Topics Concern   Not on file  Social History Narrative   Not on file   Social Determinants of Health   Financial Resource Strain: Not on file  Food Insecurity: Not on file  Transportation Needs: Not on file  Physical Activity: Not on file  Stress: Not on file  Social Connections: Not on file  Intimate Partner Violence: Not on file    FAMILY HISTORY: Family History  Problem Relation Age of Onset   Diabetes Mother    Hyperlipidemia Mother    Hypertension Mother    Alcohol abuse Father    Hypertension Father    Obesity Father    Hyperlipidemia Father    Diabetes Father    Depression Sister    Anxiety disorder Sister    Diabetes Sister    Diabetes Brother    Hyperlipidemia Brother    Hypertension Brother    Obesity Paternal Grandmother    Breast cancer Paternal Aunt    Stomach cancer Paternal Uncle     ALLERGIES:  is allergic to cashew nut oil, penicillins, and pistachio nut extract skin test.  MEDICATIONS:  Current Outpatient Medications  Medication Sig Dispense Refill   Ascorbic Acid (VITAMIN C) 100 MG tablet Take by mouth.     Calcium Carbonate (CALCIUM 500 PO) Take 1 tablet by mouth 4 (four) times daily.     ergocalciferol (VITAMIN D2) 1.25 MG (50000 UT) capsule Take 50,000 Units by mouth once a week.     ferrous sulfate 325 (65 FE) MG tablet Take 1 tablet (325 mg total) by mouth 2 (two) times daily with a meal. 60 tablet 2   gabapentin (NEURONTIN) 300 MG capsule Take by mouth.     Multiple Vitamins-Minerals (MULTIVITAMIN ADULT EXTRA C) CHEW Chew 1 tablet by mouth daily.     ferrous fumarate (HEMOCYTE - 106 MG FE) 325 (106 FE) MG TABS tablet Take 1 tablet by mouth 2 (two) times daily. (Patient not taking: Reported on  06/21/2021)     No current facility-administered medications for this visit.     PHYSICAL EXAMINATION: ECOG PERFORMANCE STATUS: 0 - Asymptomatic Vitals:   06/21/21 1210  BP: 108/68  Pulse: 72  Resp: 16  Temp: (!) 97.4 F (36.3 C)   Filed Weights   06/21/21 1210  Weight: 133 lb 12.8 oz (60.7 kg)    Physical Exam Constitutional:      General: She is not in acute distress. HENT:     Head: Normocephalic and atraumatic.  Eyes:     General: No scleral icterus. Cardiovascular:     Rate and Rhythm: Normal rate and regular rhythm.     Heart sounds: Normal heart sounds.  Pulmonary:     Effort: Pulmonary effort is normal. No respiratory distress.     Breath sounds: No wheezing.  Abdominal:     General: Bowel sounds are normal. There is no distension.  Palpations: Abdomen is soft.  Musculoskeletal:        General: No deformity. Normal range of motion.     Cervical back: Normal range of motion and neck supple.  Skin:    General: Skin is warm and dry.     Findings: No erythema or rash.  Neurological:     Mental Status: She is alert and oriented to person, place, and time. Mental status is at baseline.     Cranial Nerves: No cranial nerve deficit.     Coordination: Coordination normal.  Psychiatric:        Mood and Affect: Mood normal.      CMP Latest Ref Rng & Units 06/21/2021  Glucose 70 - 99 mg/dL 87  BUN 6 - 20 mg/dL 13  Creatinine 0.44 - 1.00 mg/dL 0.63  Sodium 135 - 145 mmol/L 138  Potassium 3.5 - 5.1 mmol/L 3.9  Chloride 98 - 111 mmol/L 109  CO2 22 - 32 mmol/L 23  Calcium 8.9 - 10.3 mg/dL 8.6(L)  Total Protein 6.5 - 8.1 g/dL 6.8  Total Bilirubin 0.3 - 1.2 mg/dL 0.5  Alkaline Phos 38 - 126 U/L 73  AST 15 - 41 U/L 21  ALT 0 - 44 U/L 15   CBC Latest Ref Rng & Units 06/21/2021  WBC 4.0 - 10.5 K/uL 4.4  Hemoglobin 12.0 - 15.0 g/dL 11.4(L)  Hematocrit 36.0 - 46.0 % 37.1  Platelets 150 - 400 K/uL 210     LABORATORY DATA:  I have reviewed the data as  listed Lab Results  Component Value Date   WBC 4.4 06/21/2021   HGB 11.4 (L) 06/21/2021   HCT 37.1 06/21/2021   MCV 86.3 06/21/2021   PLT 210 06/21/2021   Recent Labs    11/28/20 1327 04/21/21 1252 06/21/21 1146  NA 139 137 138  K 3.9 4.1 3.9  CL 105 108 109  CO2 '24 22 23  '$ GLUCOSE 92 90 87  BUN '10 16 13  '$ CREATININE 0.68 0.79 0.63  CALCIUM 9.0 8.9 8.6*  GFRNONAA >60 >60 >60  PROT  --   --  6.8  ALBUMIN  --   --  4.2  AST  --   --  21  ALT  --   --  15  ALKPHOS  --   --  73  BILITOT  --   --  0.5    Iron/TIBC/Ferritin/ %Sat    Component Value Date/Time   IRON 85 06/21/2021 1146   TIBC 409 06/21/2021 1146   FERRITIN 79 06/21/2021 1146   IRONPCTSAT 21 06/21/2021 1146     RADIOGRAPHIC STUDIES: I have personally reviewed the radiological images as listed and agreed with the findings in the report. No results found.     ASSESSMENT & PLAN:  1. Iron deficiency anemia due to chronic blood loss   2. Menorrhagia, premenopausal   3. History of gastric bypass    Iron deficiency anemia Labs are reviewed and discussed with patient Hemoglobin has improved to 11.4.  Close to her baseline. She may continue oral iron supplementation.  No need for IV Venofer treatments at this point.  Menorrhagia, recommend patient to see gynecology for further evaluation-she has upcoming appointment for hysterectomy discussion.  History of gastric bypass History of bariatric surgeries, continue multi vitamins.  Orders Placed This Encounter  Procedures   CBC with Differential/Platelet    Standing Status:   Future    Standing Expiration Date:   06/21/2022   Ferritin  Standing Status:   Future    Standing Expiration Date:   06/21/2022   Iron and TIBC    Standing Status:   Future    Standing Expiration Date:   06/21/2022    All questions were answered. The patient knows to call the clinic with any problems questions or concerns.  Cc Gayland Curry, MD  Return of visit: 3 months with  NP/MD for reevaluation of need of additional IV Venofer treatments.  Earlie Server, MD, PhD Hematology Oncology [place] at [facility] 06/21/2021

## 2021-07-11 ENCOUNTER — Encounter: Payer: Self-pay | Admitting: Obstetrics and Gynecology

## 2021-07-24 DIAGNOSIS — Z9884 Bariatric surgery status: Secondary | ICD-10-CM | POA: Insufficient documentation

## 2021-08-01 ENCOUNTER — Encounter: Payer: Self-pay | Admitting: Obstetrics and Gynecology

## 2021-08-15 ENCOUNTER — Encounter: Payer: Self-pay | Admitting: Oncology

## 2021-08-15 ENCOUNTER — Ambulatory Visit (INDEPENDENT_AMBULATORY_CARE_PROVIDER_SITE_OTHER): Payer: BC Managed Care – PPO | Admitting: Obstetrics and Gynecology

## 2021-08-15 ENCOUNTER — Other Ambulatory Visit: Payer: Self-pay

## 2021-08-15 ENCOUNTER — Encounter: Payer: Self-pay | Admitting: Obstetrics and Gynecology

## 2021-08-15 VITALS — BP 118/76 | HR 69 | Ht 63.5 in | Wt 136.2 lb

## 2021-08-15 DIAGNOSIS — N920 Excessive and frequent menstruation with regular cycle: Secondary | ICD-10-CM | POA: Diagnosis not present

## 2021-08-15 DIAGNOSIS — D219 Benign neoplasm of connective and other soft tissue, unspecified: Secondary | ICD-10-CM

## 2021-08-15 NOTE — Progress Notes (Signed)
HPI:      Tammy Todd is a 40 y.o. No obstetric history on file. who LMP was Patient's last menstrual period was 08/12/2021 (exact date).  Subjective:   She presents today because she has been experiencing very heavy bleeding with each menstrual period.  She often drops her blood count and has received iron infusions for this heavy bleeding.  Hemoglobin as low as 8 in the past.  She states her periods have been heavy for her entire life.  She denies bleeding between menstrual periods. She has previously tried OCPs for heavy menstrual bleeding (51-month trial) but she says it made no difference. She has a known history of uterine fibroids but has not had an ultrasound within 6 years. She has no children but she does not desire fertility. She has a previous duodenal switch for obesity and this has worked well for her. She would like to discuss her management options     Hx: The following portions of the patient's history were reviewed and updated as appropriate:             She  has a past medical history of Abnormal weight gain (11/03/2018), Anemia, Anxiety, Class 3 severe obesity due to excess calories with serious comorbidity and body mass index (BMI) of 45.0 to 49.9 in adult [facility]) (11/03/2018), Depression, GERD (gastroesophageal reflux disease), H. pylori infection, Hiatal hernia, Iron deficiency (04/03/2015), and Scoliosis. She does not have any pertinent problems on file. She  has a past surgical history that includes Colonoscopy with propofol (N/A, 09/22/2019); Esophagogastroduodenoscopy (egd) with propofol (N/A, 09/22/2019); Bariatric Surgery; and Gastroplasty duodenal switch (10/24/2019). Her family history includes Alcohol abuse in her father; Anxiety disorder in her sister; Breast cancer in her paternal aunt; Depression in her sister; Diabetes in her brother, father, mother, and sister; Hyperlipidemia in her brother, father, and mother; Hypertension in her brother, father,  and mother; Obesity in her father and paternal grandmother; Stomach cancer in her paternal uncle. She  reports that she quit smoking about 11 years ago. Her smoking use included cigarettes. She has never used smokeless tobacco. She reports current alcohol use. She reports that she does not use drugs. She has a current medication list which includes the following prescription(s): vitamin c, calcium carbonate, ergocalciferol, gabapentin, multivitamin adult extra c, ferrous fumarate, and ferrous sulfate. She is allergic to cashew nut oil, penicillins, and pistachio nut extract skin test.       Review of Systems:  Review of Systems  Constitutional: Denied constitutional symptoms, night sweats, recent illness, fatigue, fever, insomnia and weight loss.  Eyes: Denied eye symptoms, eye pain, photophobia, vision change and visual disturbance.  Ears/Nose/Throat/Neck: Denied ear, nose, throat or neck symptoms, hearing loss, nasal discharge, sinus congestion and sore throat.  Cardiovascular: Denied cardiovascular symptoms, arrhythmia, chest pain/pressure, edema, exercise intolerance, orthopnea and palpitations.  Respiratory: Denied pulmonary symptoms, asthma, pleuritic pain, productive sputum, cough, dyspnea and wheezing.  Gastrointestinal: Denied, gastro-esophageal reflux, melena, nausea and vomiting.  Genitourinary: See HPI for additional information.  Musculoskeletal: Denied musculoskeletal symptoms, stiffness, swelling, muscle weakness and myalgia.  Dermatologic: Denied dermatology symptoms, rash and scar.  Neurologic: Denied neurology symptoms, dizziness, headache, neck pain and syncope.  Psychiatric: Denied psychiatric symptoms, anxiety and depression.  Endocrine: Denied endocrine symptoms including hot flashes and night sweats.   Meds:   Current Outpatient Medications on File Prior to Visit  Medication Sig Dispense Refill   Ascorbic Acid (VITAMIN C) 100 MG tablet Take by mouth.  Calcium  Carbonate (CALCIUM 500 PO) Take 1 tablet by mouth 4 (four) times daily.     ergocalciferol (VITAMIN D2) 1.25 MG (50000 UT) capsule Take 50,000 Units by mouth once a week.     gabapentin (NEURONTIN) 300 MG capsule Take by mouth.     Multiple Vitamins-Minerals (MULTIVITAMIN ADULT EXTRA C) CHEW Chew 1 tablet by mouth daily.     ferrous fumarate (HEMOCYTE - 106 MG FE) 325 (106 FE) MG TABS tablet Take 1 tablet by mouth 2 (two) times daily. (Patient not taking: No sig reported)     ferrous sulfate 325 (65 FE) MG tablet Take 1 tablet (325 mg total) by mouth 2 (two) times daily with a meal. 60 tablet 2   No current facility-administered medications on file prior to visit.      Objective:     Vitals:   08/15/21 1332  BP: 118/76  Pulse: 69   Filed Weights   08/15/21 1332  Weight: 136 lb 3.2 oz (61.8 kg)                        Assessment:    No obstetric history on file. Patient Active Problem List   Diagnosis Date Noted   Iron deficiency anemia due to chronic blood loss 04/25/2021   Excessive daytime sleepiness 09/07/2019   Gastroesophageal reflux disease 09/07/2019   Hiatal hernia 09/07/2019   Snoring 09/07/2019   Vitamin D deficiency 12/13/2018   Chronic midline low back pain without sciatica 11/03/2018   Encounter for weight loss counseling 11/03/2018   Generalized anxiety disorder 11/03/2018   Iron deficiency 04/03/2015   Dysmenorrhea 08/03/2014   Female fertility problem 08/03/2014     1. Menorrhagia with regular cycle   2. Fibroids     It is likely the fibroids responsible for her very heavy menstrual bleeding.  She has previously failed hormonal therapy.   Plan:            1.  I have discussed multiple options with her.  These include IUD, endometrial ablation, fibroid embolization, and different types of hysterectomy.  She states that she is leaning toward hysterectomy. Plan ultrasound for size and location of uterine fibroids.  I will see her after her  ultrasound and we will review the findings and discuss definitive future management.  Orders Orders Placed This Encounter  Procedures   US PELVIS (TRANSABDOMINAL ONLY)   US PELVIS TRANSVAGINAL NON-OB (TV ONLY)    No orders of the defined types were placed in this encounter.     F/U  No follow-ups on file. I spent 34 minutes involved in the care of this patient preparing to see the patient by obtaining and reviewing her medical history (including labs, imaging tests and prior procedures), documenting clinical information in the electronic health record (EHR), counseling and coordinating care plans, writing and sending prescriptions, ordering tests or procedures and in direct communicating with the patient and medical staff discussing pertinent items from her history and physical exam.  Finis Bud, M.D. 08/15/2021 2:09 PM

## 2021-08-28 ENCOUNTER — Other Ambulatory Visit: Payer: BC Managed Care – PPO

## 2021-08-29 ENCOUNTER — Other Ambulatory Visit: Payer: Self-pay | Admitting: Obstetrics and Gynecology

## 2021-08-29 ENCOUNTER — Other Ambulatory Visit: Payer: Self-pay

## 2021-08-29 ENCOUNTER — Other Ambulatory Visit: Payer: BC Managed Care – PPO

## 2021-08-29 ENCOUNTER — Ambulatory Visit (INDEPENDENT_AMBULATORY_CARE_PROVIDER_SITE_OTHER): Payer: BC Managed Care – PPO

## 2021-08-29 DIAGNOSIS — N920 Excessive and frequent menstruation with regular cycle: Secondary | ICD-10-CM

## 2021-08-29 DIAGNOSIS — D219 Benign neoplasm of connective and other soft tissue, unspecified: Secondary | ICD-10-CM

## 2021-09-05 ENCOUNTER — Encounter: Payer: Self-pay | Admitting: Obstetrics and Gynecology

## 2021-09-05 ENCOUNTER — Encounter: Payer: 59 | Admitting: Obstetrics and Gynecology

## 2021-09-05 ENCOUNTER — Ambulatory Visit (INDEPENDENT_AMBULATORY_CARE_PROVIDER_SITE_OTHER): Payer: BC Managed Care – PPO | Admitting: Obstetrics and Gynecology

## 2021-09-05 ENCOUNTER — Other Ambulatory Visit: Payer: Self-pay

## 2021-09-05 VITALS — BP 117/81 | HR 64 | Ht 63.0 in | Wt 137.7 lb

## 2021-09-05 DIAGNOSIS — N920 Excessive and frequent menstruation with regular cycle: Secondary | ICD-10-CM | POA: Diagnosis not present

## 2021-09-05 DIAGNOSIS — D219 Benign neoplasm of connective and other soft tissue, unspecified: Secondary | ICD-10-CM | POA: Diagnosis not present

## 2021-09-05 MED ORDER — NORETHINDRONE 0.35 MG PO TABS: 1.0000 | ORAL_TABLET | Freq: Every day | ORAL | 0 refills | Status: DC

## 2021-09-05 NOTE — Progress Notes (Signed)
HPI:      [name] is a 40 y.o. No obstetric history on file. who LMP was Patient's last menstrual period was 08/12/2021 (exact date).  Subjective:   She presents today for follow-up after her ultrasound.  She has a history of very heavy menstrual bleeding and previous history of uterine fibroids.  Her hemoglobin dropped as low as 8.  She is not currently bleeding and has not bled for a few weeks.  She is taking oral iron.    Hx: The following portions of the patient's history were reviewed and updated as appropriate:             She  has a past medical history of Abnormal weight gain (11/03/2018), Anemia, Anxiety, Class 3 severe obesity due to excess calories with serious comorbidity and body mass index (BMI) of 45.0 to 49.9 in adult [facility]) (11/03/2018), Depression, GERD (gastroesophageal reflux disease), H. pylori infection, Hiatal hernia, Iron deficiency (04/03/2015), and Scoliosis. She does not have any pertinent problems on file. She  has a past surgical history that includes Colonoscopy with propofol (N/A, 09/22/2019); Esophagogastroduodenoscopy (egd) with propofol (N/A, 09/22/2019); Bariatric Surgery; and Gastroplasty duodenal switch (10/24/2019). Her family history includes Alcohol abuse in her father; Anxiety disorder in her sister; Breast cancer in her paternal aunt; Depression in her sister; Diabetes in her brother, father, mother, and sister; Hyperlipidemia in her brother, father, and mother; Hypertension in her brother, father, and mother; Obesity in her father and paternal grandmother; Stomach cancer in her paternal uncle. She  reports that she quit smoking about 11 years ago. Her smoking use included cigarettes. She has never used smokeless tobacco. She reports current alcohol use. She reports that she does not use drugs. She has a current medication list which includes the following prescription(s): vitamin c, calcium carbonate, ergocalciferol, gabapentin, multivitamin  adult extra c, norethindrone, ferrous fumarate, and ferrous sulfate. She is allergic to cashew nut oil, penicillins, and pistachio nut extract skin test.       Review of Systems:  Review of Systems  Constitutional: Denied constitutional symptoms, night sweats, recent illness, fatigue, fever, insomnia and weight loss.  Eyes: Denied eye symptoms, eye pain, photophobia, vision change and visual disturbance.  Ears/Nose/Throat/Neck: Denied ear, nose, throat or neck symptoms, hearing loss, nasal discharge, sinus congestion and sore throat.  Cardiovascular: Denied cardiovascular symptoms, arrhythmia, chest pain/pressure, edema, exercise intolerance, orthopnea and palpitations.  Respiratory: Denied pulmonary symptoms, asthma, pleuritic pain, productive sputum, cough, dyspnea and wheezing.  Gastrointestinal: Denied, gastro-esophageal reflux, melena, nausea and vomiting.  Genitourinary: See HPI for additional information.  Musculoskeletal: Denied musculoskeletal symptoms, stiffness, swelling, muscle weakness and myalgia.  Dermatologic: Denied dermatology symptoms, rash and scar.  Neurologic: Denied neurology symptoms, dizziness, headache, neck pain and syncope.  Psychiatric: Denied psychiatric symptoms, anxiety and depression.  Endocrine: Denied endocrine symptoms including hot flashes and night sweats.   Meds:   Current Outpatient Medications on File Prior to Visit  Medication Sig Dispense Refill   Ascorbic Acid (VITAMIN C) 100 MG tablet Take by mouth.     Calcium Carbonate (CALCIUM 500 PO) Take 1 tablet by mouth 4 (four) times daily.     ergocalciferol (VITAMIN D2) 1.25 MG (50000 UT) capsule Take 50,000 Units by mouth once a week.     gabapentin (NEURONTIN) 300 MG capsule Take by mouth.     Multiple Vitamins-Minerals (MULTIVITAMIN ADULT EXTRA C) CHEW Chew 1 tablet by mouth daily.     ferrous fumarate (HEMOCYTE - 106 MG  FE) 325 (106 FE) MG TABS tablet Take 1 tablet by mouth 2 (two) times daily.  (Patient not taking: No sig reported)     ferrous sulfate 325 (65 FE) MG tablet Take 1 tablet (325 mg total) by mouth 2 (two) times daily with a meal. 60 tablet 2   No current facility-administered medications on file prior to visit.      Objective:     Vitals:   09/05/21 0954  BP: 117/81  Pulse: 64   Filed Weights   09/05/21 0954  Weight: 137 lb 11.2 oz (62.5 kg)              Ultrasound results reviewed directly with the patient.          Assessment:    No obstetric history on file. Patient Active Problem List   Diagnosis Date Noted   Iron deficiency anemia due to chronic blood loss 04/25/2021   Excessive daytime sleepiness 09/07/2019   Gastroesophageal reflux disease 09/07/2019   Hiatal hernia 09/07/2019   Snoring 09/07/2019   Vitamin D deficiency 12/13/2018   Chronic midline low back pain without sciatica 11/03/2018   Encounter for weight loss counseling 11/03/2018   Generalized anxiety disorder 11/03/2018   Iron deficiency 04/03/2015   Dysmenorrhea 08/03/2014   Female fertility problem 08/03/2014     1. Menorrhagia with regular cycle   2. Fibroids     Fibroids likely causing her heavy menstrual bleeding.  Low risk for endometrial hyperplasia based on appearance and blood flow.   Plan:            1.  Discussed multiple options for management of fibroids including hormonal manipulation, IUD, endometrial ablation, fibroid embolization, and definitive management with surgery.  The patient once again has expressed her desire for definitive management/hysterectomy. She would like to schedule this in February next year.  2.  I will try to decrease her menstrual bleeding between now and then and allow her to continue to improve her hemoglobin prior to surgery.  Have placed her on Micronor. Orders No orders of the defined types were placed in this encounter.    Meds ordered this encounter  Medications   norethindrone (MICRONOR) 0.35 MG tablet    Sig: Take 1  tablet (0.35 mg total) by mouth daily.    Dispense:  84 tablet    Refill:  0      F/U  Return in about 3 months (around 12/06/2021). I spent 22 minutes involved in the care of this patient preparing to see the patient by obtaining and reviewing her medical history (including labs, imaging tests and prior procedures), documenting clinical information in the electronic health record (EHR), counseling and coordinating care plans, writing and sending prescriptions, ordering tests or procedures and in direct communicating with the patient and medical staff discussing pertinent items from her history and physical exam.  Finis Bud, M.D. 09/05/2021 10:26 AM

## 2021-09-17 ENCOUNTER — Encounter: Payer: Self-pay | Admitting: Oncology

## 2021-09-18 ENCOUNTER — Encounter: Payer: Self-pay | Admitting: Oncology

## 2021-09-19 ENCOUNTER — Inpatient Hospital Stay: Payer: BC Managed Care – PPO | Attending: Nurse Practitioner

## 2021-09-23 ENCOUNTER — Ambulatory Visit: Payer: Self-pay

## 2021-09-23 ENCOUNTER — Ambulatory Visit: Payer: Self-pay | Admitting: Nurse Practitioner

## 2021-09-26 ENCOUNTER — Inpatient Hospital Stay: Payer: BC Managed Care – PPO

## 2021-09-26 ENCOUNTER — Inpatient Hospital Stay: Payer: BC Managed Care – PPO | Admitting: Nurse Practitioner

## 2021-10-14 ENCOUNTER — Encounter: Payer: Self-pay | Admitting: Obstetrics and Gynecology

## 2021-10-17 ENCOUNTER — Telehealth: Payer: Self-pay | Admitting: Obstetrics and Gynecology

## 2021-10-17 ENCOUNTER — Other Ambulatory Visit: Payer: Self-pay | Admitting: Obstetrics and Gynecology

## 2021-10-17 DIAGNOSIS — N920 Excessive and frequent menstruation with regular cycle: Secondary | ICD-10-CM

## 2021-10-17 MED ORDER — NORETHINDRONE ACETATE 5 MG PO TABS: 5.0000 mg | ORAL_TABLET | Freq: Two times a day (BID) | ORAL | 2 refills | Status: DC

## 2021-10-17 NOTE — Telephone Encounter (Signed)
Pt called with concerns about her anemia and bleeding, states that she had been using Rx as directed on second pack and bleeding is the same. Please Advise.

## 2021-10-17 NOTE — Telephone Encounter (Signed)
Pt is calling in stating that she would like to have a call back.  Pt is concerned that she has been bleeding for over 10 days and would like to know what she should do about it.  She is aware that the provider was called to the hospital and once he reads the msg someone will give her a call to let her know what the provider advise.

## 2021-10-18 ENCOUNTER — Encounter: Payer: Self-pay | Admitting: Oncology

## 2021-10-18 NOTE — Telephone Encounter (Signed)
Called patient. Lvm and sent mychart message about her vaginal bleeding.

## 2021-10-18 NOTE — Telephone Encounter (Signed)
See previous note from 04/25/21, signing note

## 2021-10-21 ENCOUNTER — Other Ambulatory Visit: Payer: Self-pay | Admitting: Obstetrics and Gynecology

## 2021-10-21 NOTE — Telephone Encounter (Signed)
Yes this message was sent last week to patient.

## 2021-10-28 ENCOUNTER — Other Ambulatory Visit: Payer: Self-pay

## 2021-10-28 ENCOUNTER — Encounter: Payer: Self-pay | Admitting: Nurse Practitioner

## 2021-10-28 DIAGNOSIS — D500 Iron deficiency anemia secondary to blood loss (chronic): Secondary | ICD-10-CM

## 2021-10-28 NOTE — Telephone Encounter (Signed)
Patient added on for labs (hold tube) tomorrow and then [facility] with possible transfusion on Wed. I tried to call her,but no answer. I will respond to her MyChart message

## 2021-10-29 ENCOUNTER — Inpatient Hospital Stay: Payer: BC Managed Care – PPO

## 2021-10-29 ENCOUNTER — Telehealth: Payer: Self-pay | Admitting: Oncology

## 2021-10-29 ENCOUNTER — Encounter: Payer: Self-pay | Admitting: Obstetrics and Gynecology

## 2021-10-29 NOTE — Telephone Encounter (Signed)
Pt called to reschedule her appt for 12-13. Can only do Thursday's and wants everything done in one day. Call back at [phone]

## 2021-10-29 NOTE — Telephone Encounter (Signed)
Attempted to contact pt to move appts to Thursday. Unable to leave VM. Will try again before the end of business today.

## 2021-10-29 NOTE — Telephone Encounter (Signed)
Per Blari, pt getting poss iron not blood. Ok to schedule lab/ NP/ poss blood in 1 day. Labs will need to be early morning and NP/ poss iron in the afternoon, for it to be one day

## 2021-10-30 ENCOUNTER — Inpatient Hospital Stay: Payer: BC Managed Care – PPO

## 2021-10-30 ENCOUNTER — Inpatient Hospital Stay: Payer: BC Managed Care – PPO | Admitting: Hospice and Palliative Medicine

## 2021-10-31 ENCOUNTER — Inpatient Hospital Stay: Payer: BC Managed Care – PPO | Attending: Hospice and Palliative Medicine

## 2021-10-31 ENCOUNTER — Inpatient Hospital Stay (HOSPITAL_BASED_OUTPATIENT_CLINIC_OR_DEPARTMENT_OTHER): Payer: BC Managed Care – PPO | Admitting: Hospice and Palliative Medicine

## 2021-10-31 ENCOUNTER — Inpatient Hospital Stay: Payer: BC Managed Care – PPO

## 2021-10-31 ENCOUNTER — Other Ambulatory Visit: Payer: Self-pay

## 2021-10-31 VITALS — BP 107/70 | HR 75 | Temp 97.0°F | Resp 16

## 2021-10-31 VITALS — BP 113/73 | HR 50 | Temp 98.2°F | Resp 16 | Wt 133.0 lb

## 2021-10-31 DIAGNOSIS — D500 Iron deficiency anemia secondary to blood loss (chronic): Secondary | ICD-10-CM

## 2021-10-31 DIAGNOSIS — N920 Excessive and frequent menstruation with regular cycle: Secondary | ICD-10-CM | POA: Diagnosis present

## 2021-10-31 DIAGNOSIS — Z87891 Personal history of nicotine dependence: Secondary | ICD-10-CM | POA: Insufficient documentation

## 2021-10-31 DIAGNOSIS — Z79899 Other long term (current) drug therapy: Secondary | ICD-10-CM | POA: Insufficient documentation

## 2021-10-31 LAB — CBC WITH DIFFERENTIAL/PLATELET
Abs Immature Granulocytes: 0.01 10*3/uL (ref 0.00–0.07)
Basophils Absolute: 0 10*3/uL (ref 0.0–0.1)
Basophils Relative: 1 %
Eosinophils Absolute: 0.1 10*3/uL (ref 0.0–0.5)
Eosinophils Relative: 2 %
HCT: 40.9 % (ref 36.0–46.0)
Hemoglobin: 12.7 g/dL (ref 12.0–15.0)
Immature Granulocytes: 0 %
Lymphocytes Relative: 46 %
Lymphs Abs: 1.8 10*3/uL (ref 0.7–4.0)
MCH: 28.6 pg (ref 26.0–34.0)
MCHC: 31.1 g/dL (ref 30.0–36.0)
MCV: 92.1 fL (ref 80.0–100.0)
Monocytes Absolute: 0.3 10*3/uL (ref 0.1–1.0)
Monocytes Relative: 9 %
Neutro Abs: 1.6 10*3/uL — ABNORMAL LOW (ref 1.7–7.7)
Neutrophils Relative %: 42 %
Platelets: 242 10*3/uL (ref 150–400)
RBC: 4.44 MIL/uL (ref 3.87–5.11)
RDW: 14.2 % (ref 11.5–15.5)
WBC: 3.8 10*3/uL — ABNORMAL LOW (ref 4.0–10.5)
nRBC: 0 % (ref 0.0–0.2)

## 2021-10-31 LAB — FERRITIN: Ferritin: 12 ng/mL (ref 11–307)

## 2021-10-31 LAB — IRON AND TIBC
Iron: 69 ug/dL (ref 28–170)
Saturation Ratios: 14 % (ref 10.4–31.8)
TIBC: 483 ug/dL — ABNORMAL HIGH (ref 250–450)
UIBC: 414 ug/dL

## 2021-10-31 LAB — SAMPLE TO BLOOD BANK

## 2021-10-31 MED ORDER — SODIUM CHLORIDE 0.9 % IV SOLN
Freq: Once | INTRAVENOUS | Status: AC
  Filled 2021-10-31: qty 250

## 2021-10-31 MED ORDER — SODIUM CHLORIDE 0.9 % IV SOLN: 200.0000 mg | Freq: Once | INTRAVENOUS | Status: DC

## 2021-10-31 MED ORDER — IRON SUCROSE 20 MG/ML IV SOLN
200.0000 mg | Freq: Once | INTRAVENOUS | Status: AC
  Administered 2021-10-31: 200 mg via INTRAVENOUS
  Filled 2021-10-31: qty 10

## 2021-10-31 NOTE — Patient Instructions (Signed)

## 2021-10-31 NOTE — Progress Notes (Signed)
Hematology/Oncology progress  note [place] at [facility] [phone] [phone]   Patient Care Team: Gayland Curry, MD as PCP - General (Family Medicine)  REFERRING PROVIDER: Gayland Curry, MD CHIEF COMPLAINTS/REASON FOR VISIT:  Evaluation of iron deficiency anemia  HISTORY OF PRESENTING ILLNESS:  Tammy Todd is a  40 y.o.  female with PMH listed below was seen in consultation at the request of Gayland Curry, MD for evaluation of iron deficiency anemia.   Reviewed patient's previous labs  05/07/2020 [facility] CBC showed hemoglobin 10.7, MCV 73, RDW 16.5, WBC 6.9, normal differential platelet count 427,000.  Creatinine 0.88 sodium 141, potassium 4.2, calcium 9.2, albumin 4.3, bilirubin 1.3, copper 640 MCG/l, folate 14.3, vitamin D 25 hydroxy 38.3, vitamin K one 0.05 vitamin B12 more than 2000, zinc 85, ferritin 5, iron 37 Patient's previous labs ordered by primary care physician's office, anemia is chronic onset  No aggravating or improving factors.  History of iron deficiency: Longstanding history of iron deficiency.  Patient takes oral iron supplementation ferrous sulfate 325 mg twice daily. Rectal bleeding: Denies Menstrual bleeding/ Vaginal bleeding : Heavy menstrual bleeding Hematemesis or hemoptysis : denies Blood in urine : denies  History of bariatric surgery.  INTERVAL HISTORY Tammy Todd is a 40 y.o. female who has history of gastric bypass and menorrhagia.  Presents for follow up visit for management of iron deficiency anemia. Patient was seen by Dr. Tasia Catchings on 06/21/2021 at which time hemoglobin had improved to 11.4 and patient was recommended to continue oral iron supplementation without need for IV Venofer at that point.  Last Venofer was given in July 2022.  Patient continued to have menorrhagia and was recommended to see gynecology for further evaluation.  Today, patient reports that she is doing  reasonably well.  She had recent episode of prolonged and heavy menstrual flow after her gynecologist started her on OCP but this subsided on 10/22/2021.  Patient has discontinued the OCP.  She denies any further bleeding.  No GI or GU symptoms.  No melena or hematochezia.  Patient does endorse fatigue but denies shortness of breath or other constitutional symptoms.  Patient is scheduled for a hysterectomy next month.   Review of Systems  Constitutional:  Positive for fatigue. Negative for appetite change, chills and fever.  HENT:   Negative for hearing loss and voice change.   Eyes:  Negative for eye problems.  Respiratory:  Negative for chest tightness and cough.   Cardiovascular:  Negative for chest pain.  Gastrointestinal:  Negative for abdominal distention, abdominal pain and blood in stool.  Endocrine: Negative for hot flashes.  Genitourinary:  Positive for menstrual problem. Negative for difficulty urinating and frequency.   Musculoskeletal:  Negative for arthralgias.  Skin:  Negative for itching and rash.  Neurological:  Negative for extremity weakness.  Hematological:  Negative for adenopathy.  Psychiatric/Behavioral:  Negative for confusion.    MEDICAL HISTORY:  Past Medical History:  Diagnosis Date   Abnormal weight gain 11/03/2018   Anemia    Anxiety    Class 3 severe obesity due to excess calories with serious comorbidity and body mass index (BMI) of 45.0 to 49.9 in adult ([place]) 11/03/2018   Depression    GERD (gastroesophageal reflux disease)    H. pylori infection    Hiatal hernia    Iron deficiency 04/03/2015   Scoliosis     SURGICAL HISTORY: Past Surgical History:  Procedure Laterality Date   BARIATRIC SURGERY  COLONOSCOPY WITH PROPOFOL N/A 09/22/2019   Procedure: COLONOSCOPY WITH PROPOFOL;  Surgeon: Toledo, Benay Pike, MD;  Location: [facility] ENDOSCOPY;  Service: Endoscopy;  Laterality: N/A;   ESOPHAGOGASTRODUODENOSCOPY (EGD) WITH PROPOFOL N/A 09/22/2019    Procedure: ESOPHAGOGASTRODUODENOSCOPY (EGD) WITH PROPOFOL;  Surgeon: Toledo, Benay Pike, MD;  Location: [facility] ENDOSCOPY;  Service: Endoscopy;  Laterality: N/A;   GASTROPLASTY DUODENAL SWITCH  10/24/2019    SOCIAL HISTORY: Social History   Socioeconomic History   Marital status: Married    Spouse name: Not on file   Number of children: Not on file   Years of education: Not on file   Highest education level: Not on file  Occupational History   Not on file  Tobacco Use   Smoking status: Former    Years: 9.00    Types: Cigarettes    Quit date: 12/2009    Years since quitting: 11.8   Smokeless tobacco: Never  Vaping Use   Vaping Use: Never used  Substance and Sexual Activity   Alcohol use: Yes    Comment: social   Drug use: No   Sexual activity: Yes  Other Topics Concern   Not on file  Social History Narrative   Not on file   Social Determinants of Health   Financial Resource Strain: Not on file  Food Insecurity: Not on file  Transportation Needs: Not on file  Physical Activity: Not on file  Stress: Not on file  Social Connections: Not on file  Intimate Partner Violence: Not on file    FAMILY HISTORY: Family History  Problem Relation Age of Onset   Diabetes Mother    Hyperlipidemia Mother    Hypertension Mother    Alcohol abuse Father    Hypertension Father    Obesity Father    Hyperlipidemia Father    Diabetes Father    Depression Sister    Anxiety disorder Sister    Diabetes Sister    Diabetes Brother    Hyperlipidemia Brother    Hypertension Brother    Obesity Paternal Grandmother    Breast cancer Paternal Aunt    Stomach cancer Paternal Uncle     ALLERGIES:  is allergic to cashew nut oil, penicillins, and pistachio nut extract skin test.  MEDICATIONS:  Current Outpatient Medications  Medication Sig Dispense Refill   Ascorbic Acid (VITAMIN C) 100 MG tablet Take by mouth.     Calcium Carbonate (CALCIUM 500 PO) Take 1 tablet by mouth 4 (four) times  daily.     ergocalciferol (VITAMIN D2) 1.25 MG (50000 UT) capsule Take 50,000 Units by mouth once a week.     ferrous fumarate (HEMOCYTE - 106 MG FE) 325 (106 FE) MG TABS tablet Take 1 tablet by mouth 2 (two) times daily. (Patient not taking: No sig reported)     ferrous sulfate 325 (65 FE) MG tablet Take 1 tablet (325 mg total) by mouth 2 (two) times daily with a meal. 60 tablet 2   gabapentin (NEURONTIN) 300 MG capsule Take by mouth.     Multiple Vitamins-Minerals (MULTIVITAMIN ADULT EXTRA C) CHEW Chew 1 tablet by mouth daily.     norethindrone (AYGESTIN) 5 MG tablet Take 1 tablet (5 mg total) by mouth 2 (two) times daily. As directed 60 tablet 2   norethindrone (MICRONOR) 0.35 MG tablet Take 1 tablet (0.35 mg total) by mouth daily. 84 tablet 0   No current facility-administered medications for this visit.     PHYSICAL EXAMINATION: ECOG PERFORMANCE STATUS: 0 - Asymptomatic There  were no vitals filed for this visit.  There were no vitals filed for this visit.   Physical Exam Constitutional:      General: She is not in acute distress.    Appearance: Normal appearance.  HENT:     Head: Normocephalic and atraumatic.  Eyes:     General: No scleral icterus. Cardiovascular:     Rate and Rhythm: Normal rate and regular rhythm.     Heart sounds: Normal heart sounds.  Pulmonary:     Effort: Pulmonary effort is normal. No respiratory distress.     Breath sounds: No wheezing.  Abdominal:     General: Bowel sounds are normal. There is no distension.     Palpations: Abdomen is soft.  Musculoskeletal:        General: No deformity. Normal range of motion.     Cervical back: Normal range of motion and neck supple.  Skin:    General: Skin is warm and dry.     Findings: No erythema or rash.  Neurological:     Mental Status: She is alert and oriented to person, place, and time. Mental status is at baseline.     Cranial Nerves: No cranial nerve deficit.     Coordination: Coordination  normal.  Psychiatric:        Mood and Affect: Mood normal.      CMP Latest Ref Rng & Units 06/21/2021  Glucose 70 - 99 mg/dL 87  BUN 6 - 20 mg/dL 13  Creatinine 0.44 - 1.00 mg/dL 0.63  Sodium 135 - 145 mmol/L 138  Potassium 3.5 - 5.1 mmol/L 3.9  Chloride 98 - 111 mmol/L 109  CO2 22 - 32 mmol/L 23  Calcium 8.9 - 10.3 mg/dL 8.6(L)  Total Protein 6.5 - 8.1 g/dL 6.8  Total Bilirubin 0.3 - 1.2 mg/dL 0.5  Alkaline Phos 38 - 126 U/L 73  AST 15 - 41 U/L 21  ALT 0 - 44 U/L 15   CBC Latest Ref Rng & Units 06/21/2021  WBC 4.0 - 10.5 K/uL 4.4  Hemoglobin 12.0 - 15.0 g/dL 11.4(L)  Hematocrit 36.0 - 46.0 % 37.1  Platelets 150 - 400 K/uL 210     LABORATORY DATA:  I have reviewed the data as listed Lab Results  Component Value Date   WBC 4.4 06/21/2021   HGB 11.4 (L) 06/21/2021   HCT 37.1 06/21/2021   MCV 86.3 06/21/2021   PLT 210 06/21/2021   Recent Labs    11/28/20 1327 04/21/21 1252 06/21/21 1146  NA 139 137 138  K 3.9 4.1 3.9  CL 105 108 109  CO2 24 22 23   GLUCOSE 92 90 87  BUN 10 16 13   CREATININE 0.68 0.79 0.63  CALCIUM 9.0 8.9 8.6*  GFRNONAA >60 >60 >60  PROT  --   --  6.8  ALBUMIN  --   --  4.2  AST  --   --  21  ALT  --   --  15  ALKPHOS  --   --  73  BILITOT  --   --  0.5   Iron/TIBC/Ferritin/ %Sat    Component Value Date/Time   IRON 85 06/21/2021 1146   TIBC 409 06/21/2021 1146   FERRITIN 79 06/21/2021 1146   IRONPCTSAT 21 06/21/2021 1146     RADIOGRAPHIC STUDIES: I have personally reviewed the radiological images as listed and agreed with the findings in the report. No results found.     ASSESSMENT & PLAN:  1.  Iron deficiency anemia due to chronic blood loss    Iron deficiency anemia Labs are reviewed and discussed with patient Hemoglobin has normalized to 12.7.  Iron level has also improved at 69 with saturation of 14% and ferritin low normal at 12.  Discussed with Dr. Tasia Catchings and despite normalization of labs, patient is at risk of downtrending  given her history of gastric bypass, recent heavy menstrual cycle, and upcoming surgery.  We will proceed with maintenance Venofer 200 mg x 1 today and repeat dose in 2 weeks. She may continue oral iron supplementation.  Plan for follow-up labs/MD/possible Venofer in 6 months.  Menorrhagia Patient is scheduled for hysterectomy next month per gynecology  Orders Placed This Encounter  Procedures   CBC with Differential    Standing Status:   Future    Standing Expiration Date:   10/31/2022   Iron and TIBC    Standing Status:   Future    Standing Expiration Date:   10/31/2022   Ferritin    Standing Status:   Future    Standing Expiration Date:   10/31/2022     All questions were answered. The patient knows to call the clinic with any problems questions or concerns.  Cc Gayland Curry, MD  Return of visit: 3 months with NP/MD for reevaluation of need of additional IV Venofer treatments.   Time Total: 15 minutes  Greater than 50%  of this time was spent counseling and coordinating care related to the above assessment and plan.  Signed by: Altha Harm, PhD, NP-C

## 2021-10-31 NOTE — Progress Notes (Signed)
Pt reports that she has felt extremely tired and had low energy for the last few weeks. She is concerned that her iron levels may be low. She was prompted by her PCP to f/u with [place] due to abnormal lab values. Her OBGYN wants to ensure iron levels are adequate before proceeding with hysterectomy in Jan. 2023.

## 2021-11-07 ENCOUNTER — Inpatient Hospital Stay: Payer: BC Managed Care – PPO

## 2021-11-07 ENCOUNTER — Other Ambulatory Visit: Payer: Self-pay

## 2021-11-07 VITALS — BP 106/68 | HR 61 | Temp 96.6°F | Resp 20

## 2021-11-07 DIAGNOSIS — D500 Iron deficiency anemia secondary to blood loss (chronic): Secondary | ICD-10-CM | POA: Diagnosis not present

## 2021-11-07 MED ORDER — SODIUM CHLORIDE 0.9 % IV SOLN
INTRAVENOUS | Status: DC
  Filled 2021-11-07: qty 250

## 2021-11-07 MED ORDER — SODIUM CHLORIDE 0.9 % IV SOLN: 200.0000 mg | Freq: Once | INTRAVENOUS | Status: DC

## 2021-11-07 MED ORDER — IRON SUCROSE 20 MG/ML IV SOLN
200.0000 mg | Freq: Once | INTRAVENOUS | Status: AC
  Administered 2021-11-07: 15:00:00 200 mg via INTRAVENOUS
  Filled 2021-11-07: qty 10

## 2021-11-07 NOTE — Patient Instructions (Signed)

## 2021-11-14 ENCOUNTER — Ambulatory Visit (INDEPENDENT_AMBULATORY_CARE_PROVIDER_SITE_OTHER): Payer: BC Managed Care – PPO | Admitting: Obstetrics and Gynecology

## 2021-11-14 ENCOUNTER — Encounter: Payer: Self-pay | Admitting: Obstetrics and Gynecology

## 2021-11-14 ENCOUNTER — Other Ambulatory Visit: Payer: Self-pay

## 2021-11-14 VITALS — BP 121/78 | HR 80 | Ht 63.0 in | Wt 135.0 lb

## 2021-11-14 DIAGNOSIS — N920 Excessive and frequent menstruation with regular cycle: Secondary | ICD-10-CM

## 2021-11-14 DIAGNOSIS — D219 Benign neoplasm of connective and other soft tissue, unspecified: Secondary | ICD-10-CM

## 2021-11-14 MED ORDER — METRONIDAZOLE 500 MG PO TABS: 500.0000 mg | ORAL_TABLET | Freq: Two times a day (BID) | ORAL | 0 refills | Status: DC

## 2021-11-14 MED ORDER — TRANEXAMIC ACID 650 MG PO TABS: 1300.0000 mg | ORAL_TABLET | Freq: Three times a day (TID) | ORAL | 0 refills | Status: AC

## 2021-11-14 NOTE — Progress Notes (Signed)
Patient presents for pre-op appointment for upcoming hysterectomy. Patient states she would like to review the details of the surgery and when to arrive. Patient states no concerns at this time.

## 2021-11-14 NOTE — Progress Notes (Signed)
PRE-OPERATIVE HISTORY AND PHYSICAL EXAM  PCP:  Gayland Curry, MD Subjective:   HPI:  Tammy Todd is a 40 y.o. G2P0020.  Patient's last menstrual period was 11/11/2021 (exact date).  She presents today for a pre-op discussion and PE.  She has the following symptoms: Uterine fibroids-very heavy menstrual bleeding dropping her blood count with each cycle.  Failure of hormonal control  Review of Systems:   Constitutional: Denied constitutional symptoms, night sweats, recent illness, fatigue, fever, insomnia and weight loss.  Eyes: Denied eye symptoms, eye pain, photophobia, vision change and visual disturbance.  Ears/Nose/Throat/Neck: Denied ear, nose, throat or neck symptoms, hearing loss, nasal discharge, sinus congestion and sore throat.  Cardiovascular: Denied cardiovascular symptoms, arrhythmia, chest pain/pressure, edema, exercise intolerance, orthopnea and palpitations.  Respiratory: Denied pulmonary symptoms, asthma, pleuritic pain, productive sputum, cough, dyspnea and wheezing.  Gastrointestinal: Denied, gastro-esophageal reflux, melena, nausea and vomiting.  Genitourinary: See HPI for additional information.  Musculoskeletal: Denied musculoskeletal symptoms, stiffness, swelling, muscle weakness and myalgia.  Dermatologic: Denied dermatology symptoms, rash and scar.  Neurologic: Denied neurology symptoms, dizziness, headache, neck pain and syncope.  Psychiatric: Denied psychiatric symptoms, anxiety and depression.  Endocrine: Denied endocrine symptoms including hot flashes and night sweats.   OB History  Gravida Para Term Preterm AB Living  2       2    SAB IAB Ectopic Multiple Live Births  1 1          # Outcome Date GA Lbr Len/2nd Weight Sex Delivery Anes PTL Lv  2 SAB 2011          1 IAB 2001            Past Medical History:  Diagnosis Date   Abnormal weight gain 11/03/2018   Anemia    Anxiety    Class 3 severe obesity due to excess calories  with serious comorbidity and body mass index (BMI) of 45.0 to 49.9 in adult ([place]) 11/03/2018   Depression    GERD (gastroesophageal reflux disease)    H. pylori infection    Hiatal hernia    Iron deficiency 04/03/2015   Scoliosis     Past Surgical History:  Procedure Laterality Date   BARIATRIC SURGERY     COLONOSCOPY WITH PROPOFOL N/A 09/22/2019   Procedure: COLONOSCOPY WITH PROPOFOL;  Surgeon: Toledo, Benay Pike, MD;  Location: [facility] END[facility]COPY;  Service: Endoscopy;  Laterality: N/A;   ESOPHAGOGASTRODUODENOSCOPY (EGD) WITH PROPOFOL N/A 09/22/2019   Procedure: ESOPHAGOGASTRODUODENOSCOPY (EGD) WITH PROPOFOL;  Surgeon: Toledo, Benay Pike, MD;  Location: [facility] ENDOSCOPY;  Service: Endoscopy;  Laterality: N/A;   GASTROPLASTY DUODENAL SWITCH  10/24/2019   HIATAL HERNIA REPAIR  10/24/2019      SOCIAL HISTORY:  Social History   Tobacco Use  Smoking Status Former   Years: 9.00   Types: Cigarettes   Quit date: 12/2009   Years since quitting: 11.9  Smokeless Tobacco Never   Social History   Substance and Sexual Activity  Alcohol Use Yes   Comment: social    Social History   Substance and Sexual Activity  Drug Use No    Family History  Problem Relation Age of Onset   Diabetes Mother    Hyperlipidemia Mother    Hypertension Mother    Alcohol abuse Father    Hypertension Father    Obesity Father    Hyperlipidemia Father    Diabetes Father    Depression Sister    Anxiety disorder Sister  Diabetes Sister    Diabetes Brother    Hyperlipidemia Brother    Hypertension Brother    Obesity Paternal Grandmother    Breast cancer Paternal Aunt    Stomach cancer Paternal Uncle     ALLERGIES:  Cashew nut oil, Penicillins, and Pistachio nut extract skin test  MEDS:   Current Outpatient Medications on File Prior to Visit  Medication Sig Dispense Refill   acetaminophen (TYLENOL) 500 MG tablet Take 1,000 mg by mouth daily.     Calcium Carbonate (CALCIUM 500 PO) Take 500 mg by  mouth 4 (four) times daily.     cholecalciferol (VITAMIN D3) 25 MCG (1000 UNIT) tablet Take 1,000 Units by mouth daily.     gabapentin (NEURONTIN) 300 MG capsule Take 300 mg by mouth daily as needed (pain).     Multiple Vitamin (MULTIVITAMIN WITH MINERALS) TABS tablet Take 1 tablet by mouth in the morning and at bedtime.     ferrous sulfate 325 (65 FE) MG tablet Take 1 tablet (325 mg total) by mouth 2 (two) times daily with a meal. 60 tablet 2   No current facility-administered medications on file prior to visit.    Meds ordered this encounter  Medications   metroNIDAZOLE (FLAGYL) 500 MG tablet    Sig: Take 1 tablet (500 mg total) by mouth 2 (two) times daily. Begin 5 days prior to scheduled surgery as directed.    Dispense:  10 tablet    Refill:  0   tranexamic acid (LYSTEDA) 650 MG TABS tablet    Sig: Take 2 tablets (1,300 mg total) by mouth 3 (three) times daily for 4 days. Take during menses for a maximum of five days    Dispense:  24 tablet    Refill:  0     Physical examination BP 121/78    Pulse 80    Ht 5\' 3"  (1.6 m)    Wt 135 lb (61.2 kg)    LMP 11/11/2021 (Exact Date)    BMI 23.91 kg/m   General NAD, Conversant  HEENT Atraumatic; Op clear with mmm.  Normo-cephalic. Pupils reactive. Anicteric sclerae  Thyroid/Neck Smooth without nodularity or enlargement. Normal ROM.  Neck Supple.  Skin No rashes, lesions or ulceration. Normal palpated skin turgor. No nodularity.  Breasts: No masses or discharge.  Symmetric.  No axillary adenopathy.  Lungs: Clear to auscultation.No rales or wheezes. Normal Respiratory effort, no retractions.  Heart: NSR.  No murmurs or rubs appreciated. No periferal edema  Abdomen: Soft.  Non-tender.  No masses.  No HSM. No hernia  Extremities: Moves all appropriately.  Normal ROM for age. No lymphadenopathy.  Neuro: Oriented to PPT.  Normal mood. Normal affect.     Pelvic:   Vulva: Normal appearance.  No lesions.  Vagina: No lesions or abnormalities  noted.  Support: Normal pelvic support.  Urethra No masses tenderness or scarring.  Meatus Normal size without lesions or prolapse.  Cervix: Normal ectropion.  No lesions.  Anus: Normal exam.  No lesions.  Perineum: Normal exam.  No lesions.        Bimanual   Uterus: Enlarged-12 weeks non-tender.  Mobile.  AV.  Adnexae: No masses.  Non-tender to palpation.  Cul-de-sac: Negative for abnormality.   US Findings:  The uterus is retroverted and measures 8.3 x 5.2 x 9.2 cm. Echo texture is heterogenous with evidence of focal masses. Within the uterus are multiple suspected fibroids measuring: Fibroid 1:   5.1 x 4.3 x 4.4 cm  Right  Fundal, Subserosal Fibroid 2:   2.3 x 2.1 x 2.8 cm  Right Posterior, Subserosal   The Endometrium measures 17.2 mm, echogenic w/ little to no color flow on doppler.     Right Ovary is not visualized. Left Ovary measures 3.0 x 2.2 x 2.3 cm. It is normal in appearance. Survey of the adnexa demonstrates no adnexal masses. There is small amount of  free fluid in the cul de sac.   Impression: 1. Fibroid uterus, largest measuring 5.1 cm in the fundus. 2. Endometrium 17.2 mm. 3. Small amount FF in Cul de sac. Assessment:   G2P0020 Patient Active Problem List   Diagnosis Date Noted   Iron deficiency anemia due to chronic blood loss 04/25/2021   Excessive daytime sleepiness 09/07/2019   Gastroesophageal reflux disease 09/07/2019   Hiatal hernia 09/07/2019   Snoring 09/07/2019   Vitamin D deficiency 12/13/2018   Chronic midline low back pain without sciatica 11/03/2018   Encounter for weight loss counseling 11/03/2018   Generalized anxiety disorder 11/03/2018   Iron deficiency 04/03/2015   Dysmenorrhea 08/03/2014   Female fertility problem 08/03/2014    1. Menorrhagia with regular cycle   2. Fibroids      Plan:   Orders: Meds ordered this encounter  Medications   metroNIDAZOLE (FLAGYL) 500 MG tablet    Sig: Take 1 tablet (500 mg total) by mouth  2 (two) times daily. Begin 5 days prior to scheduled surgery as directed.    Dispense:  10 tablet    Refill:  0   tranexamic acid (LYSTEDA) 650 MG TABS tablet    Sig: Take 2 tablets (1,300 mg total) by mouth 3 (three) times daily for 4 days. Take during menses for a maximum of five days    Dispense:  24 tablet    Refill:  0     1.  LAVH bilateral salpingectomy  Pre-op discussions regarding Risks and Benefits of her scheduled surgery.  LAVH The procedure of Laparoscopic Assisted Vaginal Hysterectomy was described to the patient in detail.  We reviewed the rationale for Hysterectomy and the patient was again informed of other nonsurgical management possibilities for her condition.  She has considered these other options, and desires a Hysterectomy.  We have reviewed the fact that Hysterectomy is permanent and that following the procedure she will not be able to become pregnant or bear children.  We have discussed the following risk factors specifically and the patient has also been informed that additional complications not mentioned may develop:  Damage to bowel, bladder, ureters or to other internal organs, bleeding, infection and the risk from anesthesia.  We have discussed the procedure itself in detail and she has an informed understanding of this surgery.  We have also discussed the recovery period in which physical and sexual activity will be restricted for a varying degree of time, often 3 - 6 weeks. The Laparoscopic Portion of Hysterectomy has also been reviewed with the patient.  She understands how the laparoscope facilitates the procedure.  We have discussed the abdominal incisions and punctures that will be used.  We have also reviewed the increased Operating Room time often accompanying LAVH.  The slightly increased risk of complications secondary to abdominal punctures, and use of laparoscopic instrumentation has also been discussed in detail.I have answered all of her questions and I  believe the patient has an informed understanding of the procedure of Laparoscopic Assisted Vaginal Hysterectomy.  I spent 41 minutes involved in the care of this patient  preparing to see the patient by obtaining and reviewing her medical history (including labs, imaging tests and prior procedures), documenting clinical information in the electronic health record (EHR), counseling and coordinating care plans, writing and sending prescriptions, ordering tests or procedures and in direct communicating with the patient and medical staff discussing pertinent items from her history and physical exam.  Finis Bud, M.D. 11/14/2021 2:50 PM

## 2021-11-14 NOTE — H&P (Signed)
PRE-OPERATIVE HISTORY AND PHYSICAL EXAM  PCP:  Gayland Curry, MD Subjective:   HPI:  Tammy Todd is a 40 y.o. G2P0020.  Patient's last menstrual period was 11/11/2021 (exact date).  She presents today for a pre-op discussion and PE.  She has the following symptoms: Uterine fibroids-very heavy menstrual bleeding dropping her blood count with each cycle.  Failure of hormonal control  Review of Systems:   Constitutional: Denied constitutional symptoms, night sweats, recent illness, fatigue, fever, insomnia and weight loss.  Eyes: Denied eye symptoms, eye pain, photophobia, vision change and visual disturbance.  Ears/Nose/Throat/Neck: Denied ear, nose, throat or neck symptoms, hearing loss, nasal discharge, sinus congestion and sore throat.  Cardiovascular: Denied cardiovascular symptoms, arrhythmia, chest pain/pressure, edema, exercise intolerance, orthopnea and palpitations.  Respiratory: Denied pulmonary symptoms, asthma, pleuritic pain, productive sputum, cough, dyspnea and wheezing.  Gastrointestinal: Denied, gastro-esophageal reflux, melena, nausea and vomiting.  Genitourinary: See HPI for additional information.  Musculoskeletal: Denied musculoskeletal symptoms, stiffness, swelling, muscle weakness and myalgia.  Dermatologic: Denied dermatology symptoms, rash and scar.  Neurologic: Denied neurology symptoms, dizziness, headache, neck pain and syncope.  Psychiatric: Denied psychiatric symptoms, anxiety and depression.  Endocrine: Denied endocrine symptoms including hot flashes and night sweats.   OB History  Gravida Para Term Preterm AB Living  2       2    SAB IAB Ectopic Multiple Live Births  1 1          # Outcome Date GA Lbr Len/2nd Weight Sex Delivery Anes PTL Lv  2 SAB 2011          1 IAB 2001            Past Medical History:  Diagnosis Date   Abnormal weight gain 11/03/2018   Anemia    Anxiety    Class 3 severe obesity due to excess calories  with serious comorbidity and body mass index (BMI) of 45.0 to 49.9 in adult ([place]) 11/03/2018   Depression    GERD (gastroesophageal reflux disease)    H. pylori infection    Hiatal hernia    Iron deficiency 04/03/2015   Scoliosis     Past Surgical History:  Procedure Laterality Date   BARIATRIC SURGERY     COLONOSCOPY WITH PROPOFOL N/A 09/22/2019   Procedure: COLONOSCOPY WITH PROPOFOL;  Surgeon: Toledo, Benay Pike, MD;  Location: [facility];  Service: Endoscopy;  Laterality: N/A;   ESOPHAGOGASTRODUODENOSCOPY (EGD) WITH PROPOFOL N/A 09/22/2019   Procedure: ESOPHAGOGASTRODUODENOSCOPY (EGD) WITH PROPOFOL;  Surgeon: Toledo, Benay Pike, MD;  Location: [facility];  Service: Endoscopy;  Laterality: N/A;   GASTROPLASTY DUODENAL SWITCH  10/24/2019   HIATAL HERNIA REPAIR  10/24/2019      SOCIAL HISTORY:  Social History   Tobacco Use  Smoking Status Former   Years: 9.00   Types: Cigarettes   Quit date: 12/2009   Years since quitting: 11.9  Smokeless Tobacco Never   Social History   Substance and Sexual Activity  Alcohol Use Yes   Comment: social    Social History   Substance and Sexual Activity  Drug Use No    Family History  Problem Relation Age of Onset   Diabetes Mother    Hyperlipidemia Mother    Hypertension Mother    Alcohol abuse Father    Hypertension Father    Obesity Father    Hyperlipidemia Father    Diabetes Father    Depression Sister    Anxiety disorder Sister  Diabetes Sister    Diabetes Brother    Hyperlipidemia Brother    Hypertension Brother    Obesity Paternal Grandmother    Breast cancer Paternal Aunt    Stomach cancer Paternal Uncle     ALLERGIES:  Cashew nut oil, Penicillins, and Pistachio nut extract skin test  MEDS:   Current Outpatient Medications on File Prior to Visit  Medication Sig Dispense Refill   acetaminophen (TYLENOL) 500 MG tablet Take 1,000 mg by mouth daily.     Calcium Carbonate (CALCIUM 500 PO) Take 500 mg by  mouth 4 (four) times daily.     cholecalciferol (VITAMIN D3) 25 MCG (1000 UNIT) tablet Take 1,000 Units by mouth daily.     gabapentin (NEURONTIN) 300 MG capsule Take 300 mg by mouth daily as needed (pain).     Multiple Vitamin (MULTIVITAMIN WITH MINERALS) TABS tablet Take 1 tablet by mouth in the morning and at bedtime.     ferrous sulfate 325 (65 FE) MG tablet Take 1 tablet (325 mg total) by mouth 2 (two) times daily with a meal. 60 tablet 2   norethindrone (AYGESTIN) 5 MG tablet Take 1 tablet (5 mg total) by mouth 2 (two) times daily. As directed (Patient not taking: Reported on 11/14/2021) 60 tablet 2   norethindrone (MICRONOR) 0.35 MG tablet Take 1 tablet (0.35 mg total) by mouth daily. (Patient not taking: Reported on 11/14/2021) 84 tablet 0   No current facility-administered medications on file prior to visit.    No orders of the defined types were placed in this encounter.    Physical examination LMP 11/11/2021 (Exact Date)   General NAD, Conversant  HEENT Atraumatic; Op clear with mmm.  Normo-cephalic. Pupils reactive. Anicteric sclerae  Thyroid/Neck Smooth without nodularity or enlargement. Normal ROM.  Neck Supple.  Skin No rashes, lesions or ulceration. Normal palpated skin turgor. No nodularity.  Breasts: No masses or discharge.  Symmetric.  No axillary adenopathy.  Lungs: Clear to auscultation.No rales or wheezes. Normal Respiratory effort, no retractions.  Heart: NSR.  No murmurs or rubs appreciated. No periferal edema  Abdomen: Soft.  Non-tender.  No masses.  No HSM. No hernia  Extremities: Moves all appropriately.  Normal ROM for age. No lymphadenopathy.  Neuro: Oriented to PPT.  Normal mood. Normal affect.     Pelvic:   Vulva: Normal appearance.  No lesions.  Vagina: No lesions or abnormalities noted.  Support: Normal pelvic support.  Urethra No masses tenderness or scarring.  Meatus Normal size without lesions or prolapse.  Cervix: Normal ectropion.  No lesions.   Anus: Normal exam.  No lesions.  Perineum: Normal exam.  No lesions.        Bimanual   Uterus: Enlarged-12 weeks non-tender.  Mobile.  AV.  Adnexae: No masses.  Non-tender to palpation.  Cul-de-sac: Negative for abnormality.   US Findings:  The uterus is retroverted and measures 8.3 x 5.2 x 9.2 cm. Echo texture is heterogenous with evidence of focal masses. Within the uterus are multiple suspected fibroids measuring: Fibroid 1:   5.1 x 4.3 x 4.4 cm  Right Fundal, Subserosal Fibroid 2:   2.3 x 2.1 x 2.8 cm  Right Posterior, Subserosal   The Endometrium measures 17.2 mm, echogenic w/ little to no color flow on doppler.     Right Ovary is not visualized. Left Ovary measures 3.0 x 2.2 x 2.3 cm. It is normal in appearance. Survey of the adnexa demonstrates no adnexal masses. There is small amount of  free fluid in the cul de sac.   Impression: 1. Fibroid uterus, largest measuring 5.1 cm in the fundus. 2. Endometrium 17.2 mm. 3. Small amount FF in Cul de sac. Assessment:   G2P0020 Patient Active Problem List   Diagnosis Date Noted   Iron deficiency anemia due to chronic blood loss 04/25/2021   Excessive daytime sleepiness 09/07/2019   Gastroesophageal reflux disease 09/07/2019   Hiatal hernia 09/07/2019   Snoring 09/07/2019   Vitamin D deficiency 12/13/2018   Chronic midline low back pain without sciatica 11/03/2018   Encounter for weight loss counseling 11/03/2018   Generalized anxiety disorder 11/03/2018   Iron deficiency 04/03/2015   Dysmenorrhea 08/03/2014   Female fertility problem 08/03/2014    1. Menorrhagia with regular cycle   2. Fibroids      Plan:   Orders: No orders of the defined types were placed in this encounter.    1.  LAVH bilateral salpingectomy

## 2021-11-14 NOTE — H&P (View-Only) (Signed)
PRE-OPERATIVE HISTORY AND PHYSICAL EXAM  PCP:  Gayland Curry, MD Subjective:   HPI:  Tammy Todd is a 40 y.o. G2P0020.  Patient's last menstrual period was 11/11/2021 (exact date).  She presents today for a pre-op discussion and PE.  She has the following symptoms: Uterine fibroids-very heavy menstrual bleeding dropping her blood count with each cycle.  Failure of hormonal control  Review of Systems:   Constitutional: Denied constitutional symptoms, night sweats, recent illness, fatigue, fever, insomnia and weight loss.  Eyes: Denied eye symptoms, eye pain, photophobia, vision change and visual disturbance.  Ears/Nose/Throat/Neck: Denied ear, nose, throat or neck symptoms, hearing loss, nasal discharge, sinus congestion and sore throat.  Cardiovascular: Denied cardiovascular symptoms, arrhythmia, chest pain/pressure, edema, exercise intolerance, orthopnea and palpitations.  Respiratory: Denied pulmonary symptoms, asthma, pleuritic pain, productive sputum, cough, dyspnea and wheezing.  Gastrointestinal: Denied, gastro-esophageal reflux, melena, nausea and vomiting.  Genitourinary: See HPI for additional information.  Musculoskeletal: Denied musculoskeletal symptoms, stiffness, swelling, muscle weakness and myalgia.  Dermatologic: Denied dermatology symptoms, rash and scar.  Neurologic: Denied neurology symptoms, dizziness, headache, neck pain and syncope.  Psychiatric: Denied psychiatric symptoms, anxiety and depression.  Endocrine: Denied endocrine symptoms including hot flashes and night sweats.   OB History  Gravida Para Term Preterm AB Living  2       2    SAB IAB Ectopic Multiple Live Births  1 1          # Outcome Date GA Lbr Len/2nd Weight Sex Delivery Anes PTL Lv  2 SAB 2011          1 IAB 2001            Past Medical History:  Diagnosis Date   Abnormal weight gain 11/03/2018   Anemia    Anxiety    Class 3 severe obesity due to excess calories  with serious comorbidity and body mass index (BMI) of 45.0 to 49.9 in adult ([place]) 11/03/2018   Depression    GERD (gastroesophageal reflux disease)    H. pylori infection    Hiatal hernia    Iron deficiency 04/03/2015   Scoliosis     Past Surgical History:  Procedure Laterality Date   BARIATRIC SURGERY     COLONOSCOPY WITH PROPOFOL N/A 09/22/2019   Procedure: COLONOSCOPY WITH PROPOFOL;  Surgeon: Toledo, Benay Pike, MD;  Location: [facility];  Service: Endoscopy;  Laterality: N/A;   ESOPHAGOGASTRODUODENOSCOPY (EGD) WITH PROPOFOL N/A 09/22/2019   Procedure: ESOPHAGOGASTRODUODENOSCOPY (EGD) WITH PROPOFOL;  Surgeon: Toledo, Benay Pike, MD;  Location: [facility];  Service: Endoscopy;  Laterality: N/A;   GASTROPLASTY DUODENAL SWITCH  10/24/2019   HIATAL HERNIA REPAIR  10/24/2019      SOCIAL HISTORY:  Social History   Tobacco Use  Smoking Status Former   Years: 9.00   Types: Cigarettes   Quit date: 12/2009   Years since quitting: 11.9  Smokeless Tobacco Never   Social History   Substance and Sexual Activity  Alcohol Use Yes   Comment: social    Social History   Substance and Sexual Activity  Drug Use No    Family History  Problem Relation Age of Onset   Diabetes Mother    Hyperlipidemia Mother    Hypertension Mother    Alcohol abuse Father    Hypertension Father    Obesity Father    Hyperlipidemia Father    Diabetes Father    Depression Sister    Anxiety disorder Sister  Diabetes Sister    Diabetes Brother    Hyperlipidemia Brother    Hypertension Brother    Obesity Paternal Grandmother    Breast cancer Paternal Aunt    Stomach cancer Paternal Uncle     ALLERGIES:  Cashew nut oil, Penicillins, and Pistachio nut extract skin test  MEDS:   Current Outpatient Medications on File Prior to Visit  Medication Sig Dispense Refill   acetaminophen (TYLENOL) 500 MG tablet Take 1,000 mg by mouth daily.     Calcium Carbonate (CALCIUM 500 PO) Take 500 mg by  mouth 4 (four) times daily.     cholecalciferol (VITAMIN D3) 25 MCG (1000 UNIT) tablet Take 1,000 Units by mouth daily.     gabapentin (NEURONTIN) 300 MG capsule Take 300 mg by mouth daily as needed (pain).     Multiple Vitamin (MULTIVITAMIN WITH MINERALS) TABS tablet Take 1 tablet by mouth in the morning and at bedtime.     ferrous sulfate 325 (65 FE) MG tablet Take 1 tablet (325 mg total) by mouth 2 (two) times daily with a meal. 60 tablet 2   norethindrone (AYGESTIN) 5 MG tablet Take 1 tablet (5 mg total) by mouth 2 (two) times daily. As directed (Patient not taking: Reported on 11/14/2021) 60 tablet 2   norethindrone (MICRONOR) 0.35 MG tablet Take 1 tablet (0.35 mg total) by mouth daily. (Patient not taking: Reported on 11/14/2021) 84 tablet 0   No current facility-administered medications on file prior to visit.    No orders of the defined types were placed in this encounter.    Physical examination LMP 11/11/2021 (Exact Date)   General NAD, Conversant  HEENT Atraumatic; Op clear with mmm.  Normo-cephalic. Pupils reactive. Anicteric sclerae  Thyroid/Neck Smooth without nodularity or enlargement. Normal ROM.  Neck Supple.  Skin No rashes, lesions or ulceration. Normal palpated skin turgor. No nodularity.  Breasts: No masses or discharge.  Symmetric.  No axillary adenopathy.  Lungs: Clear to auscultation.No rales or wheezes. Normal Respiratory effort, no retractions.  Heart: NSR.  No murmurs or rubs appreciated. No periferal edema  Abdomen: Soft.  Non-tender.  No masses.  No HSM. No hernia  Extremities: Moves all appropriately.  Normal ROM for age. No lymphadenopathy.  Neuro: Oriented to PPT.  Normal mood. Normal affect.     Pelvic:   Vulva: Normal appearance.  No lesions.  Vagina: No lesions or abnormalities noted.  Support: Normal pelvic support.  Urethra No masses tenderness or scarring.  Meatus Normal size without lesions or prolapse.  Cervix: Normal ectropion.  No lesions.   Anus: Normal exam.  No lesions.  Perineum: Normal exam.  No lesions.        Bimanual   Uterus: Enlarged-12 weeks non-tender.  Mobile.  AV.  Adnexae: No masses.  Non-tender to palpation.  Cul-de-sac: Negative for abnormality.   US Findings:  The uterus is retroverted and measures 8.3 x 5.2 x 9.2 cm. Echo texture is heterogenous with evidence of focal masses. Within the uterus are multiple suspected fibroids measuring: Fibroid 1:   5.1 x 4.3 x 4.4 cm  Right Fundal, Subserosal Fibroid 2:   2.3 x 2.1 x 2.8 cm  Right Posterior, Subserosal   The Endometrium measures 17.2 mm, echogenic w/ little to no color flow on doppler.     Right Ovary is not visualized. Left Ovary measures 3.0 x 2.2 x 2.3 cm. It is normal in appearance. Survey of the adnexa demonstrates no adnexal masses. There is small amount of  free fluid in the cul de sac.   Impression: 1. Fibroid uterus, largest measuring 5.1 cm in the fundus. 2. Endometrium 17.2 mm. 3. Small amount FF in Cul de sac. Assessment:   G2P0020 Patient Active Problem List   Diagnosis Date Noted   Iron deficiency anemia due to chronic blood loss 04/25/2021   Excessive daytime sleepiness 09/07/2019   Gastroesophageal reflux disease 09/07/2019   Hiatal hernia 09/07/2019   Snoring 09/07/2019   Vitamin D deficiency 12/13/2018   Chronic midline low back pain without sciatica 11/03/2018   Encounter for weight loss counseling 11/03/2018   Generalized anxiety disorder 11/03/2018   Iron deficiency 04/03/2015   Dysmenorrhea 08/03/2014   Female fertility problem 08/03/2014    1. Menorrhagia with regular cycle   2. Fibroids      Plan:   Orders: No orders of the defined types were placed in this encounter.    1.  LAVH bilateral salpingectomy

## 2021-11-15 ENCOUNTER — Other Ambulatory Visit: Payer: Self-pay

## 2021-11-15 ENCOUNTER — Encounter
Admission: RE | Admit: 2021-11-15 | Discharge: 2021-11-15 | Disposition: A | Discharge status: Home / Self Care | Payer: BC Managed Care – PPO | Source: Ambulatory Visit | Attending: Obstetrics and Gynecology | Admitting: Obstetrics and Gynecology

## 2021-11-15 NOTE — Patient Instructions (Signed)
Your procedure is scheduled on:11-22-21 Friday Report to the Registration Desk on the 1st floor of the [place].Then proceed to the 2nd floor Surgery Desk in the [place] To find out your arrival time, please call [phone] between 1PM - 3PM on:11-21-21 Thursday  REMEMBER: Instructions that are not followed completely may result in serious medical risk, up to and including death; or upon the discretion of your surgeon and anesthesiologist your surgery may need to be rescheduled.  Do not eat food after midnight the night before surgery.  No gum chewing, lozengers or hard candies.  You may however, drink CLEAR liquids up to 2 hours before you are scheduled to arrive for your surgery. Do not drink anything within 2 hours of your scheduled arrival time.  Clear liquids include: - water  - apple juice without pulp - gatorade (not RED, PURPLE, OR BLUE) - black coffee or tea (Do NOT add milk or creamers to the coffee or tea) Do NOT drink anything that is not on this list.  Do not take any medication the day of surgery  One week prior to surgery: Stop Anti-inflammatories (NSAIDS) such as Advil, Aleve, Ibuprofen, Motrin, Naproxen, Naprosyn and Aspirin based products such as Excedrin, Goodys Powder, BC Powder.You may however, continue to take Tylenol if needed for pain up until the day of surgery.  Stop ANY OVER THE COUNTER supplements/vitamins NOW (11-15-21) until after surgery-You may continue your Calcium, Vitamin D3 and multivitamin up until the day prior to surgery per our Nurse Practitioner  No Alcohol for 24 hours before or after surgery.  No Smoking including e-cigarettes for 24 hours prior to surgery.  No chewable tobacco products for at least 6 hours prior to surgery.  No nicotine patches on the day of surgery.  Do not use any "recreational" drugs for at least a week prior to your surgery.  Please be advised that the combination of cocaine and anesthesia may have negative  outcomes, up to and including death. If you test positive for cocaine, your surgery will be cancelled.  On the morning of surgery brush your teeth with toothpaste and water, you may rinse your mouth with mouthwash if you wish. Do not swallow any toothpaste or mouthwash.  Use CHG Soap as directed on instruction sheet.  Do not wear jewelry, make-up, hairpins, clips or nail polish.  Do not wear lotions, powders, or perfumes.   Do not shave body from the neck down 48 hours prior to surgery just in case you cut yourself which could leave a site for infection.  Also, freshly shaved skin may become irritated if using the CHG soap.  Contact lenses, hearing aids and dentures may not be worn into surgery.  Do not bring valuables to the hospital. [facility] is not responsible for any missing/lost belongings or valuables.   Notify your doctor if there is any change in your medical condition (cold, fever, infection).  Wear comfortable clothing (specific to your surgery type) to the hospital.  After surgery, you can help prevent lung complications by doing breathing exercises.  Take deep breaths and cough every 1-2 hours. Your doctor may order a device called an Incentive Spirometer to help you take deep breaths. When coughing or sneezing, hold a pillow firmly against your incision with both hands. This is called splinting. Doing this helps protect your incision. It also decreases belly discomfort.  If you are being admitted to the hospital overnight, leave your suitcase in the car. After surgery it may  be brought to your room.  If you are being discharged the day of surgery, you will not be allowed to drive home. You will need a responsible adult (18 years or older) to drive you home and stay with you that night.   If you are taking public transportation, you will need to have a responsible adult (18 years or older) with you. Please confirm with your physician that it is acceptable to use  public transportation.   Please call the [facility] at [phone] if you have any questions about these instructions.  Surgery Visitation Policy:  Patients undergoing a surgery or procedure may have one family member or support person with them as long as that person is not COVID-19 positive or experiencing its symptoms.  That person may remain in the waiting area during the procedure and may rotate out with other people.  Inpatient Visitation:    Visiting hours are 7 a.m. to 8 p.m. Up to two visitors ages 16+ are allowed at one time in a patient room. The visitors may rotate out with other people during the day. Visitors must check out when they leave, or other visitors will not be allowed. One designated support person may remain overnight. The visitor must pass COVID-19 screenings, use hand sanitizer when entering and exiting the patients room and wear a mask at all times, including in the patients room. Patients must also wear a mask when staff or their visitor are in the room. Masking is required regardless of vaccination status.

## 2021-11-21 ENCOUNTER — Encounter
Admission: RE | Admit: 2021-11-21 | Discharge: 2021-11-21 | Disposition: A | Discharge status: Home / Self Care | Payer: BC Managed Care – PPO | Source: Ambulatory Visit | Attending: Obstetrics and Gynecology | Admitting: Obstetrics and Gynecology

## 2021-11-21 ENCOUNTER — Other Ambulatory Visit: Payer: Self-pay

## 2021-11-21 DIAGNOSIS — Z87891 Personal history of nicotine dependence: Secondary | ICD-10-CM | POA: Diagnosis not present

## 2021-11-21 DIAGNOSIS — D251 Intramural leiomyoma of uterus: Secondary | ICD-10-CM | POA: Diagnosis present

## 2021-11-21 DIAGNOSIS — K219 Gastro-esophageal reflux disease without esophagitis: Secondary | ICD-10-CM | POA: Diagnosis not present

## 2021-11-21 DIAGNOSIS — N920 Excessive and frequent menstruation with regular cycle: Secondary | ICD-10-CM

## 2021-11-21 DIAGNOSIS — Z793 Long term (current) use of hormonal contraceptives: Secondary | ICD-10-CM | POA: Diagnosis not present

## 2021-11-21 DIAGNOSIS — N736 Female pelvic peritoneal adhesions (postinfective): Secondary | ICD-10-CM | POA: Diagnosis not present

## 2021-11-21 DIAGNOSIS — Z01812 Encounter for preprocedural laboratory examination: Secondary | ICD-10-CM | POA: Insufficient documentation

## 2021-11-21 DIAGNOSIS — N72 Inflammatory disease of cervix uteri: Secondary | ICD-10-CM | POA: Diagnosis not present

## 2021-11-21 DIAGNOSIS — N888 Other specified noninflammatory disorders of cervix uteri: Secondary | ICD-10-CM | POA: Diagnosis not present

## 2021-11-21 LAB — CBC
HCT: 38.8 % (ref 36.0–46.0)
Hemoglobin: 11.9 g/dL — ABNORMAL LOW (ref 12.0–15.0)
MCH: 28 pg (ref 26.0–34.0)
MCHC: 30.7 g/dL (ref 30.0–36.0)
MCV: 91.3 fL (ref 80.0–100.0)
Platelets: 243 10*3/uL (ref 150–400)
RBC: 4.25 MIL/uL (ref 3.87–5.11)
RDW: 14.6 % (ref 11.5–15.5)
WBC: 4.8 10*3/uL (ref 4.0–10.5)
nRBC: 0 % (ref 0.0–0.2)

## 2021-11-21 LAB — TYPE AND SCREEN
ABO/RH(D): O POS
Antibody Screen: NEGATIVE

## 2021-11-21 LAB — HCG, QUANTITATIVE, PREGNANCY: hCG, Beta Chain, Quant, S: <1 m[IU]/mL (ref <5)

## 2021-11-22 ENCOUNTER — Ambulatory Visit
Admission: RE | Admit: 2021-11-22 | Discharge: 2021-11-22 | Disposition: A | Discharge status: Home / Self Care | Payer: BC Managed Care – PPO | Attending: Obstetrics and Gynecology | Admitting: Obstetrics and Gynecology

## 2021-11-22 ENCOUNTER — Ambulatory Visit: Payer: BC Managed Care – PPO | Admitting: Anesthesiology

## 2021-11-22 ENCOUNTER — Encounter
Admission: RE | Disposition: A | Discharge status: Home / Self Care | Payer: Self-pay | Source: Home / Self Care | Attending: Obstetrics and Gynecology

## 2021-11-22 ENCOUNTER — Encounter: Payer: Self-pay | Admitting: Obstetrics and Gynecology

## 2021-11-22 DIAGNOSIS — Z87891 Personal history of nicotine dependence: Secondary | ICD-10-CM | POA: Insufficient documentation

## 2021-11-22 DIAGNOSIS — N888 Other specified noninflammatory disorders of cervix uteri: Secondary | ICD-10-CM | POA: Insufficient documentation

## 2021-11-22 DIAGNOSIS — N920 Excessive and frequent menstruation with regular cycle: Secondary | ICD-10-CM | POA: Diagnosis not present

## 2021-11-22 DIAGNOSIS — D251 Intramural leiomyoma of uterus: Secondary | ICD-10-CM | POA: Diagnosis not present

## 2021-11-22 DIAGNOSIS — Z793 Long term (current) use of hormonal contraceptives: Secondary | ICD-10-CM | POA: Insufficient documentation

## 2021-11-22 DIAGNOSIS — N736 Female pelvic peritoneal adhesions (postinfective): Secondary | ICD-10-CM | POA: Insufficient documentation

## 2021-11-22 DIAGNOSIS — N72 Inflammatory disease of cervix uteri: Secondary | ICD-10-CM | POA: Insufficient documentation

## 2021-11-22 DIAGNOSIS — D259 Leiomyoma of uterus, unspecified: Secondary | ICD-10-CM

## 2021-11-22 DIAGNOSIS — K219 Gastro-esophageal reflux disease without esophagitis: Secondary | ICD-10-CM | POA: Insufficient documentation

## 2021-11-22 HISTORY — PX: LAPAROSCOPIC ASSISTED VAGINAL HYSTERECTOMY: SHX5398

## 2021-11-22 LAB — ABO/RH: ABO/RH(D): O POS

## 2021-11-22 SURGERY — HYSTERECTOMY, VAGINAL, LAPAROSCOPY-ASSISTED
Anesthesia: General | Site: Abdomen

## 2021-11-22 MED ORDER — BUPIVACAINE HCL 0.5 % IJ SOLN
INTRAMUSCULAR | Status: DC | PRN
  Administered 2021-11-22: 16 mL

## 2021-11-22 MED ORDER — OXYCODONE HCL 5 MG PO TABS: 5.0000 mg | ORAL_TABLET | Freq: Once | ORAL | Status: AC | PRN

## 2021-11-22 MED ORDER — OXYCODONE-ACETAMINOPHEN 5-325 MG PO TABS: 1.0000 | ORAL_TABLET | ORAL | 0 refills | Status: AC | PRN

## 2021-11-22 MED ORDER — LIDOCAINE HCL (CARDIAC) PF 100 MG/5ML IV SOSY
PREFILLED_SYRINGE | INTRAVENOUS | Status: DC | PRN
Start: 2021-11-22 — End: 2021-11-22
  Administered 2021-11-22: 80 mg via INTRAVENOUS

## 2021-11-22 MED ORDER — ACETAMINOPHEN 10 MG/ML IV SOLN
INTRAVENOUS | Status: DC | PRN
  Administered 2021-11-22: 1000 mg via INTRAVENOUS

## 2021-11-22 MED ORDER — DEXMEDETOMIDINE (PRECEDEX) IN NS 20 MCG/5ML (4 MCG/ML) IV SYRINGE
PREFILLED_SYRINGE | INTRAVENOUS | Status: DC | PRN
  Administered 2021-11-22: 8 ug via INTRAVENOUS
  Administered 2021-11-22: 12 ug via INTRAVENOUS

## 2021-11-22 MED ORDER — LACTATED RINGERS IV SOLN: INTRAVENOUS | Status: DC

## 2021-11-22 MED ORDER — ACETAMINOPHEN 10 MG/ML IV SOLN
INTRAVENOUS | Status: AC
  Filled 2021-11-22: qty 100

## 2021-11-22 MED ORDER — OXYCODONE HCL 5 MG/5ML PO SOLN
5.0000 mg | Freq: Once | ORAL | Status: AC | PRN
Start: 2021-11-22 — End: 2021-11-22

## 2021-11-22 MED ORDER — FENTANYL CITRATE (PF) 100 MCG/2ML IJ SOLN
INTRAMUSCULAR | Status: AC
  Administered 2021-11-22: 50 ug via INTRAVENOUS
  Filled 2021-11-22: qty 2

## 2021-11-22 MED ORDER — SODIUM CHLORIDE (PF) 0.9 % IJ SOLN
INTRAMUSCULAR | Status: AC
  Filled 2021-11-22: qty 50

## 2021-11-22 MED ORDER — FENTANYL CITRATE (PF) 100 MCG/2ML IJ SOLN
INTRAMUSCULAR | Status: AC
  Filled 2021-11-22: qty 2

## 2021-11-22 MED ORDER — OXYCODONE-ACETAMINOPHEN 5-325 MG PO TABS: 1.0000 | ORAL_TABLET | ORAL | Status: DC | PRN

## 2021-11-22 MED ORDER — VASOPRESSIN 20 UNIT/ML IV SOLN
INTRAVENOUS | Status: DC | PRN
  Administered 2021-11-22: 8 mL via INTRAMUSCULAR

## 2021-11-22 MED ORDER — ROCURONIUM BROMIDE 10 MG/ML (PF) SYRINGE
PREFILLED_SYRINGE | INTRAVENOUS | Status: AC
  Filled 2021-11-22: qty 10

## 2021-11-22 MED ORDER — ONDANSETRON HCL 4 MG/2ML IJ SOLN
INTRAMUSCULAR | Status: DC | PRN
Start: 2021-11-22 — End: 2021-11-22
  Administered 2021-11-22 (×2): 4 mg via INTRAVENOUS

## 2021-11-22 MED ORDER — GLYCOPYRROLATE 0.2 MG/ML IJ SOLN
INTRAMUSCULAR | Status: DC | PRN
  Administered 2021-11-22: .2 mg via INTRAVENOUS

## 2021-11-22 MED ORDER — SUGAMMADEX SODIUM 200 MG/2ML IV SOLN
INTRAVENOUS | Status: DC | PRN
  Administered 2021-11-22: 200 mg via INTRAVENOUS

## 2021-11-22 MED ORDER — MIDAZOLAM HCL 2 MG/2ML IJ SOLN
INTRAMUSCULAR | Status: DC | PRN
  Administered 2021-11-22: 2 mg via INTRAVENOUS

## 2021-11-22 MED ORDER — IBUPROFEN 800 MG PO TABS: 800.0000 mg | ORAL_TABLET | Freq: Three times a day (TID) | ORAL | 0 refills | Status: DC | PRN

## 2021-11-22 MED ORDER — MIDAZOLAM HCL 2 MG/2ML IJ SOLN
INTRAMUSCULAR | Status: AC
  Filled 2021-11-22: qty 2

## 2021-11-22 MED ORDER — FENTANYL CITRATE (PF) 100 MCG/2ML IJ SOLN
INTRAMUSCULAR | Status: DC | PRN
  Administered 2021-11-22: 25 ug via INTRAVENOUS
  Administered 2021-11-22: 100 ug via INTRAVENOUS
  Administered 2021-11-22: 50 ug via INTRAVENOUS
  Administered 2021-11-22: 25 ug via INTRAVENOUS

## 2021-11-22 MED ORDER — PROPOFOL 10 MG/ML IV BOLUS
INTRAVENOUS | Status: DC | PRN
  Administered 2021-11-22: 120 mg via INTRAVENOUS

## 2021-11-22 MED ORDER — DEXTROSE IN LACTATED RINGERS 5 % IV SOLN: INTRAVENOUS | Status: DC

## 2021-11-22 MED ORDER — ONDANSETRON HCL 4 MG/2ML IJ SOLN: 4.0000 mg | Freq: Four times a day (QID) | INTRAMUSCULAR | Status: DC | PRN

## 2021-11-22 MED ORDER — FENTANYL CITRATE (PF) 100 MCG/2ML IJ SOLN
25.0000 ug | INTRAMUSCULAR | Status: DC | PRN
  Administered 2021-11-22: 50 ug via INTRAVENOUS

## 2021-11-22 MED ORDER — FAMOTIDINE 20 MG PO TABS: 20.0000 mg | ORAL_TABLET | Freq: Once | ORAL | Status: AC

## 2021-11-22 MED ORDER — POVIDONE-IODINE 10 % EX SWAB: 2.0000 "application " | Freq: Once | CUTANEOUS | Status: DC

## 2021-11-22 MED ORDER — CHLORHEXIDINE GLUCONATE 0.12 % MT SOLN
OROMUCOSAL | Status: AC
  Administered 2021-11-22: 15 mL via OROMUCOSAL
  Filled 2021-11-22: qty 15

## 2021-11-22 MED ORDER — ORAL CARE MOUTH RINSE: 15.0000 mL | Freq: Once | OROMUCOSAL | Status: AC

## 2021-11-22 MED ORDER — FAMOTIDINE 20 MG PO TABS
ORAL_TABLET | ORAL | Status: AC
  Administered 2021-11-22: 20 mg via ORAL
  Filled 2021-11-22: qty 1

## 2021-11-22 MED ORDER — OXYCODONE HCL 5 MG PO TABS
ORAL_TABLET | ORAL | Status: AC
  Administered 2021-11-22: 5 mg via ORAL
  Filled 2021-11-22: qty 1

## 2021-11-22 MED ORDER — DEXAMETHASONE SODIUM PHOSPHATE 10 MG/ML IJ SOLN
INTRAMUSCULAR | Status: DC | PRN
  Administered 2021-11-22: 10 mg via INTRAVENOUS

## 2021-11-22 MED ORDER — CHLORHEXIDINE GLUCONATE 0.12 % MT SOLN: 15.0000 mL | Freq: Once | OROMUCOSAL | Status: AC

## 2021-11-22 MED ORDER — PHENYLEPHRINE 40 MCG/ML (10ML) SYRINGE FOR IV PUSH (FOR BLOOD PRESSURE SUPPORT)
PREFILLED_SYRINGE | INTRAVENOUS | Status: DC | PRN
  Administered 2021-11-22: 80 ug via INTRAVENOUS

## 2021-11-22 MED ORDER — VASOPRESSIN 20 UNIT/ML IV SOLN
INTRAVENOUS | Status: AC
  Filled 2021-11-22: qty 1

## 2021-11-22 MED ORDER — CEFAZOLIN SODIUM-DEXTROSE 2-4 GM/100ML-% IV SOLN
INTRAVENOUS | Status: AC
  Filled 2021-11-22: qty 100

## 2021-11-22 MED ORDER — LACTATED RINGERS IV SOLN: INTRAVENOUS | Status: DC | PRN

## 2021-11-22 MED ORDER — BUPIVACAINE HCL (PF) 0.5 % IJ SOLN
INTRAMUSCULAR | Status: AC
  Filled 2021-11-22: qty 30

## 2021-11-22 MED ORDER — ROCURONIUM BROMIDE 100 MG/10ML IV SOLN
INTRAVENOUS | Status: DC | PRN
  Administered 2021-11-22 (×2): 20 mg via INTRAVENOUS
  Administered 2021-11-22: 50 mg via INTRAVENOUS

## 2021-11-22 MED ORDER — KETOROLAC TROMETHAMINE 30 MG/ML IJ SOLN: 30.0000 mg | Freq: Once | INTRAMUSCULAR | Status: DC

## 2021-11-22 MED ORDER — ONDANSETRON 4 MG PO TBDP: 4.0000 mg | ORAL_TABLET | Freq: Four times a day (QID) | ORAL | Status: DC | PRN

## 2021-11-22 MED ORDER — CEFAZOLIN SODIUM-DEXTROSE 2-4 GM/100ML-% IV SOLN
2.0000 g | INTRAVENOUS | Status: AC
  Administered 2021-11-22: 2 g via INTRAVENOUS

## 2021-11-22 SURGICAL SUPPLY — 49 items
ADHESIVE MASTISOL STRL (MISCELLANEOUS) ×2 IMPLANT
BAG URINE DRAIN 2000ML AR STRL (UROLOGICAL SUPPLIES) ×2 IMPLANT
BLADE SURG SZ11 CARB STEEL (BLADE) ×3 IMPLANT
CATH FOLEY 2WAY  5CC 16FR (CATHETERS) ×1
CATH URTH 16FR FL 2W BLN LF (CATHETERS) ×1 IMPLANT
CHLORAPREP W/TINT 26 (MISCELLANEOUS) ×2 IMPLANT
DEFOGGER SCOPE WARMER CLEARIFY (MISCELLANEOUS) ×2 IMPLANT
ELECT REM PT RETURN 9FT ADLT (ELECTROSURGICAL) ×2
ELECTRODE REM PT RTRN 9FT ADLT (ELECTROSURGICAL) ×1 IMPLANT
GAUZE 4X4 16PLY ~~LOC~~+RFID DBL (SPONGE) ×5 IMPLANT
GLOVE SURG ENC MOIS LTX SZ8 (GLOVE) ×4 IMPLANT
GLOVE SURG ORTHO LTX SZ7.5 (GLOVE) ×3 IMPLANT
GLOVE SURG POLY ORTHO LF SZ7.5 (GLOVE) ×2 IMPLANT
GOWN STRL REUS W/ TWL LRG LVL3 (GOWN DISPOSABLE) ×1 IMPLANT
GOWN STRL REUS W/ TWL XL LVL3 (GOWN DISPOSABLE) ×2 IMPLANT
GOWN STRL REUS W/TWL LRG LVL3 (GOWN DISPOSABLE) ×1
GOWN STRL REUS W/TWL XL LVL3 (GOWN DISPOSABLE) ×2
HANDLE YANKAUER SUCT BULB TIP (MISCELLANEOUS) ×2 IMPLANT
IRRIGATION STRYKERFLOW (MISCELLANEOUS) IMPLANT
IRRIGATOR STRYKERFLOW (MISCELLANEOUS) ×4
IV LACTATED RINGERS 1000ML (IV SOLUTION) ×2 IMPLANT
KIT PINK PAD W/HEAD ARE REST (MISCELLANEOUS) ×2
KIT PINK PAD W/HEAD ARM REST (MISCELLANEOUS) ×1 IMPLANT
KIT TURNOVER CYSTO (KITS) ×2 IMPLANT
LABEL OR SOLS (LABEL) ×1 IMPLANT
LIGASURE LAP MARYLAND 5MM 37CM (ELECTROSURGICAL) ×1 IMPLANT
MANIFOLD NEPTUNE II (INSTRUMENTS) ×2 IMPLANT
PACK BASIN MINOR ARMC (MISCELLANEOUS) ×2 IMPLANT
PACK GYN LAPAROSCOPIC (MISCELLANEOUS) ×2 IMPLANT
PAD OB MATERNITY 4.3X12.25 (PERSONAL CARE ITEMS) ×2 IMPLANT
PAD PREP 24X41 OB/GYN DISP (PERSONAL CARE ITEMS) ×2 IMPLANT
RETRACTOR PHONTONGUIDE ADAPT (ADAPTER) ×2 IMPLANT
SCRUB EXIDINE 4% CHG 4OZ (MISCELLANEOUS) ×2 IMPLANT
SLEEVE ENDOPATH XCEL 5M (ENDOMECHANICALS) ×4 IMPLANT
SOLUTION ELECTROLUBE (MISCELLANEOUS) ×2 IMPLANT
SPONGE T-LAP 18X18 ~~LOC~~+RFID (SPONGE) ×2 IMPLANT
STRIP CLOSURE SKIN 1/2X4 (GAUZE/BANDAGES/DRESSINGS) ×2 IMPLANT
SUT VIC AB 0 CT1 18XCR BRD 8 (SUTURE) IMPLANT
SUT VIC AB 0 CT1 27 (SUTURE) ×3
SUT VIC AB 0 CT1 27XCR 8 STRN (SUTURE) ×2 IMPLANT
SUT VIC AB 0 CT1 36 (SUTURE) ×4 IMPLANT
SUT VIC AB 0 CT1 8-18 (SUTURE) ×1
SUT VIC AB 4-0 FS2 27 (SUTURE) ×2 IMPLANT
SYR 10ML LL (SYRINGE) ×2 IMPLANT
SYR CONTROL 10ML LL (SYRINGE) ×1 IMPLANT
TAPE TRANSPORE STRL 2 31045 (GAUZE/BANDAGES/DRESSINGS) ×2 IMPLANT
TROCAR XCEL NON-BLD 5MMX100MML (ENDOMECHANICALS) ×2 IMPLANT
TUBING EVAC SMOKE HEATED PNEUM (TUBING) ×2 IMPLANT
WATER STERILE IRR 500ML POUR (IV SOLUTION) ×2 IMPLANT

## 2021-11-22 NOTE — Anesthesia Procedure Notes (Signed)
Procedure Name: Intubation Date/Time: 11/22/2021 12:51 PM Performed by: Gayland Curry, CRNA Pre-anesthesia Checklist: Patient identified, Emergency Drugs available, Suction available and Patient being monitored Patient Re-evaluated:Patient Re-evaluated prior to induction Oxygen Delivery Method: Circle system utilized Preoxygenation: Pre-oxygenation with 100% oxygen Induction Type: IV induction Ventilation: Mask ventilation without difficulty Laryngoscope Size: Mac and 3 Grade View: Grade I Tube type: Oral Tube size: 7.0 mm Number of attempts: 1 Placement Confirmation: ETT inserted through vocal cords under direct vision, positive ETCO2 and breath sounds checked- equal and bilateral Secured at: 21 cm Tube secured with: Tape Dental Injury: Teeth and Oropharynx as per pre-operative assessment

## 2021-11-22 NOTE — Transfer of Care (Signed)
Immediate Anesthesia Transfer of Care Note  Patient: Tammy Todd  Procedure(s) Performed: LAPAROSCOPIC ASSISTED VAGINAL HYSTERECTOMY AND BILATERAL SALPHINGECTOMY (Abdomen)  Patient Location: PACU  Anesthesia Type:General  Level of Consciousness: drowsy  Airway & Oxygen Therapy: Patient Spontanous Breathing and Patient connected to nasal cannula oxygen  Post-op Assessment: Report given to RN and Post -op Vital signs reviewed and stable  Post vital signs: Reviewed and stable  Last Vitals:  Vitals Value Taken Time  BP 120/73 11/22/21 1559  Temp 36.1 C 11/22/21 1559  Pulse 76 11/22/21 1559  Resp 12 11/22/21 1559  SpO2 100 % 11/22/21 1559    Last Pain:  Vitals:   11/22/21 1559  TempSrc:   PainSc: Asleep         Complications: No notable events documented.

## 2021-11-22 NOTE — Op Note (Signed)
OPERATIVE NOTE 11/22/2021 3:45 PM  PRE-OPERATIVE DIAGNOSIS:  1) Fibroids, Menorrhagia with cycles  POST-OPERATIVE DIAGNOSIS:  Same with pelvic adhesions  OPERATION: Procedure(s) (LRB): LAPAROSCOPIC ASSISTED VAGINAL HYSTERECTOMY (N/A)   Bilat salpingectomy   Removal of foreign body (clip)  SURGEON(S): Surgeon(s) and Role:    Harlin Heys, MD - Primary   ANESTHESIA: General  ESTIMATED BLOOD LOSS: 367mL  OPERATIVE FINDINGS: cul-de-sac adhesions  SPECIMEN:  ID Type Source Tests Collected by Time Destination  1 : Uterus, Cervix and Bilateral Fallopian Tubes Tissue PATH Gyn benign resection SURGICAL PATHOLOGY Harlin Heys, MD 01/16/5175 1607     COMPLICATIONS: None  DRAINS: Foley to gravity  DISPOSITION: Stable to recovery room  DESCRIPTION OF PROCEDURE:      The patient was prepped and draped in the dorsolithotomy position and placed under general anesthesia. The bladder was emptied. The cervix was grasped with a multi-toothed tenaculum and a uterine manipulator was placed within the cervical os respecting the position and curvature of the uterus. After changing gloves we proceeded abdominally. A small infraumbilical incision was made and a 5 mm trocar port was placed within the abdominopelvic cavity. The opening pressure was less than 7 mmHg.  Approximately 3 and 1/2 L of carbon dioxide gas was instilled within the abdominal pelvic cavity. The laparoscope was placed and the pelvis and abdomen were carefully inspected. In the usual manner, under direct visualization right and left lower quadrant ports of 5 mm size were placed. Both ureters were identified in the pelvis prior to dissection or clamping and cutting of pedicles.  The fallopian tubes were elevated and the mesenteric side systematically coagulated and divided allowing the tube to be removed at the time of uterine removal. The round ligaments were coagulated and divided and a bladder flap was created. The  upper aspect of the broad ligament was clamped coagulated and divided. Multiple thick and thin pelvic adhesions were noted to the post aspect of the uterus.  These were systematically coagulated and lysed.  The uterine arteries were skeletonized, triply coagulated and divided. Careful inspection of all pedicles and the remainder of the pelvis was performed. Hemostasis was noted. The lower quadrant ports were removed, hemostasis of the port sites was noted, and the incisions were closed in subcuticular manner. The laparoscope and trocar sleeve were removed from the infraumbilical incision, hemostasis was noted, and the incision was closed in a subcuticular manner. A long-acting anesthetic was employed in the skin incisions. We then proceeded vaginally. A weighted speculum was placed posteriorly. A multi-toothed tenaculum was used to grasp the cervix and the cervix was injected in a circumferential manner with a dilute Pitressin solution. An incision was made around the cervix and the vaginal mucosa was dissected off of the cervix. The posterior cul-de-sac was identified and entered and the weighted speculum was placed within this. The anterior cul-de-sac was identified and entered and a retractor was placed and used to retract the bladder anteriorly keeping it out of the operative field. The uterosacral ligaments were clamped divided and suture ligated. The cardinal ligaments were clamped divided and suture ligated. The small remaining pedicle was clamped divided and suture ligated bilaterally allowing delivery of the specimen. Angle sutures were placed in the usual manner. A culdoplasty was performed. The peritoneum was identified anteriorly and then incorporating the left upper pedicle left lower pedicle right lower pedicle right upper pedicle and anterior peritoneum a pursestring suture was placed exteriorizing all pedicles. Hemostasis of all  pedicles was noted at this time. The vaginal mucosa was then closed  with a running suture of Vicryl. We went back abdominally.  The abdomen was inflated and we carefully inspected the pelvis.  All pedicles were hemostatic. The l and r ports were removed. Hemostasis noted. The laparoscope was removed, gas allowed to escape and trocar sleeve were removed from the infraumbilical incision, hemostasis was noted, and the incision was closed in a subcuticular manner. A long-acting anesthetic was employed in the skin incisions. Dermabond was applied.  The patient went to recovery room in stable condition.   Finis Bud, M.D. 11/22/2021 3:45 PM

## 2021-11-22 NOTE — Anesthesia Preprocedure Evaluation (Signed)
Anesthesia Evaluation  Patient identified by MRN, date of birth, ID band Patient awake    Reviewed: Allergy & Precautions, NPO status , Patient's Chart, lab work & pertinent test results  Airway Mallampati: II  TM Distance: >3 FB Neck ROM: full    Dental  (+) Chipped   Pulmonary neg shortness of breath, former smoker,    Pulmonary exam normal        Cardiovascular Exercise Tolerance: Good (-) angina(-) Past MI and (-) DOE negative cardio ROS Normal cardiovascular exam     Neuro/Psych PSYCHIATRIC DISORDERS negative neurological ROS     GI/Hepatic Neg liver ROS, hiatal hernia, GERD  Medicated and Controlled,  Endo/Other  negative endocrine ROS  Renal/GU      Musculoskeletal   Abdominal   Peds  Hematology negative hematology ROS (+)   Anesthesia Other Findings Past Medical History: 11/03/2018: Abnormal weight gain No date: Anemia No date: Anxiety 11/03/2018: Class 3 severe obesity due to excess calories with  serious comorbidity and body mass index (BMI) of 45.0 to 49.9 in  adult [facility]) No date: Depression No date: GERD (gastroesophageal reflux disease) No date: H. pylori infection No date: Hiatal hernia 04/03/2015: Iron deficiency No date: Scoliosis  Past Surgical History: No date: BARIATRIC SURGERY 09/22/2019: COLONOSCOPY WITH PROPOFOL; N/A     Comment:  Procedure: COLONOSCOPY WITH PROPOFOL;  Surgeon: Toledo,               Benay Pike, MD;  Location: [facility] ENDOSCOPY;  Service:               Endoscopy;  Laterality: N/A; 09/22/2019: ESOPHAGOGASTRODUODENOSCOPY (EGD) WITH PROPOFOL; N/A     Comment:  Procedure: ESOPHAGOGASTRODUODENOSCOPY (EGD) WITH               PROPOFOL;  Surgeon: Toledo, Benay Pike, MD;  Location:               [facility] ENDOSCOPY;  Service: Endoscopy;  Laterality: N/A; 10/24/2019: GASTROPLASTY DUODENAL SWITCH 10/24/2019: HIATAL HERNIA REPAIR     Reproductive/Obstetrics negative OB ROS                              Anesthesia Physical Anesthesia Plan  ASA: 3  Anesthesia Plan: General ETT   Post-op Pain Management:    Induction: Intravenous  PONV Risk Score and Plan: Ondansetron, Dexamethasone, Midazolam and Treatment may vary due to age or medical condition  Airway Management Planned: Oral ETT  Additional Equipment:   Intra-op Plan:   Post-operative Plan: Extubation in OR  Informed Consent: I have reviewed the patients History and Physical, chart, labs and discussed the procedure including the risks, benefits and alternatives for the proposed anesthesia with the patient or authorized representative who has indicated his/her understanding and acceptance.     Dental Advisory Given  Plan Discussed with: Anesthesiologist, CRNA and Surgeon  Anesthesia Plan Comments: (Patient consented for risks of anesthesia including but not limited to:  - adverse reactions to medications - damage to eyes, teeth, lips or other oral mucosa - nerve damage due to positioning  - sore throat or hoarseness - Damage to heart, brain, nerves, lungs, other parts of body or loss of life  Patient voiced understanding.)        Anesthesia Quick Evaluation

## 2021-11-22 NOTE — Discharge Instructions (Addendum)
Slowly resume normal activities. No heavy lifting. Nothing in the vagina for 4 weeks. Do not drive while taking narcotic medications.  AMBULATORY SURGERY  DISCHARGE INSTRUCTIONS   The drugs that you were given will stay in your system until tomorrow so for the next 24 hours you should not:  Drive an automobile Make any legal decisions Drink any alcoholic beverage   You may resume regular meals tomorrow.  Today it is better to start with liquids and gradually work up to solid foods.  You may eat anything you prefer, but it is better to start with liquids, then soup and crackers, and gradually work up to solid foods.   Please notify your doctor immediately if you have any unusual bleeding, trouble breathing, redness and pain at the surgery site, drainage, fever, or pain not relieved by medication.    Additional Instructions:     Please contact your physician with any problems or Same Day Surgery at [phone], Monday through Friday 6 am to 4 pm, or [place] at [facility] number at [phone].

## 2021-11-23 ENCOUNTER — Encounter: Payer: Self-pay | Admitting: Obstetrics and Gynecology

## 2021-11-23 NOTE — Anesthesia Postprocedure Evaluation (Signed)
Anesthesia Post Note  Patient: Tammy Todd  Procedure(s) Performed: LAPAROSCOPIC ASSISTED VAGINAL HYSTERECTOMY AND BILATERAL SALPHINGECTOMY (Abdomen)  Anesthesia Type: General Anesthetic complications: no   There were no known notable events for this encounter.   Last Vitals:  Vitals:   11/22/21 1700 11/22/21 1715  BP: 103/64 103/64  Pulse: 60 (!) 57  Resp: 12 10  Temp:  (!) 36.1 C  SpO2: 100% 100%    Last Pain:  Vitals:   11/22/21 1715  TempSrc:   PainSc: [name]

## 2021-11-23 NOTE — Interval H&P Note (Signed)
History and Physical Interval Note:  11/23/2021 2:19 PM  Tammy Todd  has presented today for surgery, with the diagnosis of Fibroids, Menorrhagia with cycles.  The various methods of treatment have been discussed with the patient and family. After consideration of risks, benefits and other options for treatment, the patient has consented to  Procedure(s): [place] (N/A) as a surgical intervention.  The patient's history has been reviewed, patient examined, no change in status, stable for surgery.  I have reviewed the patient's chart and labs.  Questions were answered to the patient's satisfaction.     Jeannie Fend

## 2021-11-25 ENCOUNTER — Other Ambulatory Visit: Payer: Self-pay | Admitting: Obstetrics and Gynecology

## 2021-11-25 DIAGNOSIS — N920 Excessive and frequent menstruation with regular cycle: Secondary | ICD-10-CM

## 2021-11-25 DIAGNOSIS — D219 Benign neoplasm of connective and other soft tissue, unspecified: Secondary | ICD-10-CM

## 2021-11-26 LAB — SURGICAL PATHOLOGY

## 2021-11-29 ENCOUNTER — Telehealth: Payer: Self-pay | Admitting: Obstetrics and Gynecology

## 2021-11-29 ENCOUNTER — Other Ambulatory Visit: Payer: Self-pay

## 2021-11-29 ENCOUNTER — Encounter: Payer: Self-pay | Admitting: Obstetrics and Gynecology

## 2021-11-29 ENCOUNTER — Ambulatory Visit (INDEPENDENT_AMBULATORY_CARE_PROVIDER_SITE_OTHER): Payer: BC Managed Care – PPO | Admitting: Obstetrics and Gynecology

## 2021-11-29 VITALS — BP 102/63 | HR 81 | Ht 63.0 in | Wt 131.0 lb

## 2021-11-29 DIAGNOSIS — R350 Frequency of micturition: Secondary | ICD-10-CM

## 2021-11-29 DIAGNOSIS — Z9889 Other specified postprocedural states: Secondary | ICD-10-CM

## 2021-11-29 NOTE — Progress Notes (Signed)
HPI:      [name] is a 41 y.o. G2P0020 who LMP was Patient's last menstrual period was 11/11/2021 (exact date).  Subjective:   She presents today 1 week from LAVH for uterine fibroids and heavy bleeding.  Patient reports she is doing very well.  Has no pain.  Ambulating voiding having bowel movements without issue.  Reports minimal vaginal bleeding. Reports that she has urinary urgency frequency since the surgery.    Hx: The following portions of the patient's history were reviewed and updated as appropriate:             She  has a past medical history of Abnormal weight gain (11/03/2018), Anemia, Anxiety, Class 3 severe obesity due to excess calories with serious comorbidity and body mass index (BMI) of 45.0 to 49.9 in adult [facility]) (11/03/2018), Depression, GERD (gastroesophageal reflux disease), H. pylori infection, Hiatal hernia, Iron deficiency (04/03/2015), and Scoliosis. She does not have any pertinent problems on file. She  has a past surgical history that includes Colonoscopy with propofol (N/A, 09/22/2019); Esophagogastroduodenoscopy (egd) with propofol (N/A, 09/22/2019); Bariatric Surgery; Gastroplasty duodenal switch (10/24/2019); Hiatal hernia repair (10/24/2019); and Laparoscopic assisted vaginal hysterectomy (N/A, 11/22/2021). Her family history includes Alcohol abuse in her father; Anxiety disorder in her sister; Breast cancer in her paternal aunt; Depression in her sister; Diabetes in her brother, father, mother, and sister; Hyperlipidemia in her brother, father, and mother; Hypertension in her brother, father, and mother; Obesity in her father and paternal grandmother; Stomach cancer in her paternal uncle. She  reports that she quit smoking about 11 years ago. Her smoking use included cigarettes. She has a 2.25 pack-year smoking history. She has never used smokeless tobacco. She reports that she does not currently use alcohol. She reports that she does not use  drugs. She has a current medication list which includes the following prescription(s): acetaminophen, calcium carbonate, calcium carbonate, cholecalciferol, gabapentin, multivitamin with minerals, ferrous sulfate, and oxycodone-acetaminophen. She is allergic to cashew nut oil, penicillins, and pistachio nut extract skin test.       Review of Systems:  Review of Systems  Constitutional: Denied constitutional symptoms, night sweats, recent illness, fatigue, fever, insomnia and weight loss.  Eyes: Denied eye symptoms, eye pain, photophobia, vision change and visual disturbance.  Ears/Nose/Throat/Neck: Denied ear, nose, throat or neck symptoms, hearing loss, nasal discharge, sinus congestion and sore throat.  Cardiovascular: Denied cardiovascular symptoms, arrhythmia, chest pain/pressure, edema, exercise intolerance, orthopnea and palpitations.  Respiratory: Denied pulmonary symptoms, asthma, pleuritic pain, productive sputum, cough, dyspnea and wheezing.  Gastrointestinal: Denied, gastro-esophageal reflux, melena, nausea and vomiting.  Genitourinary: See HPI for additional information.  Musculoskeletal: Denied musculoskeletal symptoms, stiffness, swelling, muscle weakness and myalgia.  Dermatologic: Denied dermatology symptoms, rash and scar.  Neurologic: Denied neurology symptoms, dizziness, headache, neck pain and syncope.  Psychiatric: Denied psychiatric symptoms, anxiety and depression.  Endocrine: Denied endocrine symptoms including hot flashes and night sweats.   Meds:   Current Outpatient Medications on File Prior to Visit  Medication Sig Dispense Refill   acetaminophen (TYLENOL) 500 MG tablet Take 1,000 mg by mouth daily.     Calcium Carbonate (CALCIUM 500 PO) Take 500 mg by mouth 4 (four) times daily.     calcium carbonate (TUMS - DOSED IN MG ELEMENTAL CALCIUM) 500 MG chewable tablet Chew 1 tablet by mouth as needed for indigestion or heartburn.     cholecalciferol (VITAMIN D3) 25  MCG (1000 UNIT) tablet Take 1,000 Units by mouth daily.  gabapentin (NEURONTIN) 300 MG capsule Take 300 mg by mouth daily as needed (pain).     Multiple Vitamin (MULTIVITAMIN WITH MINERALS) TABS tablet Take 1 tablet by mouth in the morning and at bedtime.     ferrous sulfate 325 (65 FE) MG tablet Take 1 tablet (325 mg total) by mouth 2 (two) times daily with a meal. 60 tablet 2   oxyCODONE-acetaminophen (PERCOCET) 5-325 MG tablet Take 1-2 tablets by mouth every 4 (four) hours as needed for up to 7 days for severe pain. (Patient not taking: Reported on 11/29/2021) 20 tablet 0   No current facility-administered medications on file prior to visit.      Objective:     Vitals:   11/29/21 0849  BP: 102/63  Pulse: 81   Filed Weights   11/29/21 0849  Weight: 131 lb (59.4 kg)               Abdomen: Soft.  Non-tender.  No masses.  No HSM.  Incision/s: Intact.  Healing well.  No erythema.  No drainage.             Assessment:    G2P0020 Patient Active Problem List   Diagnosis Date Noted   Iron deficiency anemia due to chronic blood loss 04/25/2021   Excessive daytime sleepiness 09/07/2019   Gastroesophageal reflux disease 09/07/2019   Hiatal hernia 09/07/2019   Snoring 09/07/2019   Vitamin D deficiency 12/13/2018   Chronic midline low back pain without sciatica 11/03/2018   Encounter for weight loss counseling 11/03/2018   Generalized anxiety disorder 11/03/2018   Iron deficiency 04/03/2015   Dysmenorrhea 08/03/2014   Female fertility problem 08/03/2014     1. Post-operative state     Patient doing very well postop.  Some urinary urgency/frequency.   Plan:            1.  Urine for C&S.  2.  Patient may resume normal activities with exception of heavy lifting. Orders No orders of the defined types were placed in this encounter.   No orders of the defined types were placed in this encounter.     F/U  Return in about 5 weeks (around 01/03/2022).  Finis Bud, M.D. 11/29/2021 9:11 AM

## 2021-11-29 NOTE — Telephone Encounter (Signed)
LMTCB - made pt aware we need to collect FMLA fee to start processing paperwork.

## 2021-12-04 LAB — URINE CULTURE

## 2021-12-04 MED ORDER — SULFAMETHOXAZOLE-TRIMETHOPRIM 800-160 MG PO TABS: 1.0000 | ORAL_TABLET | Freq: Two times a day (BID) | ORAL | 0 refills | Status: AC

## 2021-12-04 NOTE — Addendum Note (Signed)
Addended by: Chilton Greathouse on: 12/04/2021 05:11 PM   Modules accepted: Orders

## 2021-12-04 NOTE — Progress Notes (Signed)
Crystal Results are c/w UTI - needs Rx: Bactrim DS 1 PO BID for 7 days Please call this in

## 2021-12-06 ENCOUNTER — Encounter: Payer: BC Managed Care – PPO | Admitting: Obstetrics and Gynecology

## 2021-12-11 ENCOUNTER — Telehealth: Payer: Self-pay

## 2021-12-13 ENCOUNTER — Encounter: Payer: Self-pay | Admitting: Obstetrics and Gynecology

## 2021-12-16 NOTE — Telephone Encounter (Signed)
Paperwork has been filled out. Sent to [employer] for signature. Needs to be faxed once returned.

## 2021-12-18 NOTE — Telephone Encounter (Signed)
Pt came in to to pick up forms per CMA Bri  ok for pt to fax forms from her employer office. Pt verbalized understanding, and will faxs forms from her current employer office.

## 2022-01-02 ENCOUNTER — Encounter: Payer: BC Managed Care – PPO | Admitting: Obstetrics and Gynecology

## 2022-01-02 ENCOUNTER — Other Ambulatory Visit: Payer: Self-pay

## 2022-01-02 ENCOUNTER — Ambulatory Visit (INDEPENDENT_AMBULATORY_CARE_PROVIDER_SITE_OTHER): Payer: BC Managed Care – PPO | Admitting: Obstetrics and Gynecology

## 2022-01-02 ENCOUNTER — Other Ambulatory Visit (HOSPITAL_COMMUNITY)
Admission: RE | Admit: 2022-01-02 | Discharge: 2022-01-02 | Disposition: A | Discharge status: Home / Self Care | Payer: BC Managed Care – PPO | Source: Ambulatory Visit | Attending: Obstetrics and Gynecology | Admitting: Obstetrics and Gynecology

## 2022-01-02 ENCOUNTER — Encounter: Payer: Self-pay | Admitting: Obstetrics and Gynecology

## 2022-01-02 VITALS — BP 113/71 | HR 71 | Ht 63.0 in | Wt 134.4 lb

## 2022-01-02 DIAGNOSIS — R350 Frequency of micturition: Secondary | ICD-10-CM

## 2022-01-02 DIAGNOSIS — R208 Other disturbances of skin sensation: Secondary | ICD-10-CM | POA: Insufficient documentation

## 2022-01-02 DIAGNOSIS — Z9889 Other specified postprocedural states: Secondary | ICD-10-CM

## 2022-01-02 NOTE — Progress Notes (Signed)
HPI:      [name] is a 41 y.o. G2P0020 who LMP was Patient's last menstrual period was 11/11/2021 (exact date).  Subjective:   She presents today approximately 6 weeks from LAVH for uterine fibroids.  She reports that she is generally doing well.  Not having pain.  She is eating voiding and having bowel movements without issue. She does state that she has noticed some increased urinary frequency again.  She had this 1 week post op and was found to have a urinary tract infection.  She took antibiotics and resolved.  She states it has now returned. In addition she states that over the last 2 days she has noticed some vulvar/vaginal burning.  Is not related to bowel movements urination or any thing else.    Hx: The following portions of the patient's history were reviewed and updated as appropriate:             She  has a past medical history of Abnormal weight gain (11/03/2018), Anemia, Anxiety, Class 3 severe obesity due to excess calories with serious comorbidity and body mass index (BMI) of 45.0 to 49.9 in adult [facility]) (11/03/2018), Depression, GERD (gastroesophageal reflux disease), H. pylori infection, Hiatal hernia, Iron deficiency (04/03/2015), and Scoliosis. She does not have any pertinent problems on file. She  has a past surgical history that includes Colonoscopy with propofol (N/A, 09/22/2019); Esophagogastroduodenoscopy (egd) with propofol (N/A, 09/22/2019); Bariatric Surgery; Gastroplasty duodenal switch (10/24/2019); Hiatal hernia repair (10/24/2019); and Laparoscopic assisted vaginal hysterectomy (N/A, 11/22/2021). Her family history includes Alcohol abuse in her father; Anxiety disorder in her sister; Breast cancer in her paternal aunt; Depression in her sister; Diabetes in her brother, father, mother, and sister; Hyperlipidemia in her brother, father, and mother; Hypertension in her brother, father, and mother; Obesity in her father and paternal grandmother; Stomach  cancer in her paternal uncle. She  reports that she quit smoking about 12 years ago. Her smoking use included cigarettes. She has a 2.25 pack-year smoking history. She has never used smokeless tobacco. She reports that she does not currently use alcohol. She reports that she does not use drugs. She has a current medication list which includes the following prescription(s): acetaminophen, calcium carbonate, calcium carbonate, cholecalciferol, gabapentin, multivitamin with minerals, vitamin e, and ferrous sulfate. She is allergic to cashew nut oil, penicillins, and pistachio nut extract skin test.       Review of Systems:  Review of Systems  Constitutional: Denied constitutional symptoms, night sweats, recent illness, fatigue, fever, insomnia and weight loss.  Eyes: Denied eye symptoms, eye pain, photophobia, vision change and visual disturbance.  Ears/Nose/Throat/Neck: Denied ear, nose, throat or neck symptoms, hearing loss, nasal discharge, sinus congestion and sore throat.  Cardiovascular: Denied cardiovascular symptoms, arrhythmia, chest pain/pressure, edema, exercise intolerance, orthopnea and palpitations.  Respiratory: Denied pulmonary symptoms, asthma, pleuritic pain, productive sputum, cough, dyspnea and wheezing.  Gastrointestinal: Denied, gastro-esophageal reflux, melena, nausea and vomiting.  Genitourinary: See HPI for additional information.  Musculoskeletal: Denied musculoskeletal symptoms, stiffness, swelling, muscle weakness and myalgia.  Dermatologic: Denied dermatology symptoms, rash and scar.  Neurologic: Denied neurology symptoms, dizziness, headache, neck pain and syncope.  Psychiatric: Denied psychiatric symptoms, anxiety and depression.  Endocrine: Denied endocrine symptoms including hot flashes and night sweats.   Meds:   Current Outpatient Medications on File Prior to Visit  Medication Sig Dispense Refill   acetaminophen (TYLENOL) 500 MG tablet Take 1,000 mg by mouth  daily.     Calcium Carbonate (  CALCIUM 500 PO) Take 500 mg by mouth 4 (four) times daily.     calcium carbonate (TUMS - DOSED IN MG ELEMENTAL CALCIUM) 500 MG chewable tablet Chew 1 tablet by mouth as needed for indigestion or heartburn.     cholecalciferol (VITAMIN D3) 25 MCG (1000 UNIT) tablet Take 1,000 Units by mouth daily.     gabapentin (NEURONTIN) 300 MG capsule Take 300 mg by mouth daily as needed (pain).     Multiple Vitamin (MULTIVITAMIN WITH MINERALS) TABS tablet Take 1 tablet by mouth in the morning and at bedtime.     VITAMIN E PO Take by mouth.     ferrous sulfate 325 (65 FE) MG tablet Take 1 tablet (325 mg total) by mouth 2 (two) times daily with a meal. 60 tablet 2   No current facility-administered medications on file prior to visit.      Objective:     Vitals:   01/02/22 1320  BP: 113/71  Pulse: 71   Filed Weights   01/02/22 1320  Weight: 134 lb 6.4 oz (61 kg)            Physical examination   Pelvic:   Vulva: Normal appearance.  No lesions.  Vagina: No lesions or abnormalities noted.  Vaginal cuff healing well  Support: Normal pelvic support.  Urethra No masses tenderness or scarring.  Meatus Normal size without lesions or prolapse.  Cervix: Surgically absent  Anus: Normal exam.  No lesions.  Perineum: Normal exam.  No lesions.        Bimanual   Uterus: Surgically absent  Adnexae: No masses.  Non-tender to palpation.  Cul-de-sac: Negative for abnormality.             Assessment:    G2P0020 Patient Active Problem List   Diagnosis Date Noted   Iron deficiency anemia due to chronic blood loss 04/25/2021   Excessive daytime sleepiness 09/07/2019   Gastroesophageal reflux disease 09/07/2019   Hiatal hernia 09/07/2019   Snoring 09/07/2019   Vitamin D deficiency 12/13/2018   Chronic midline low back pain without sciatica 11/03/2018   Encounter for weight loss counseling 11/03/2018   Generalized anxiety disorder 11/03/2018   Iron deficiency  04/03/2015   Dysmenorrhea 08/03/2014   Female fertility problem 08/03/2014     1. Post-operative state   2. Urinary frequency     Patient with some urinary frequency and recently some vulvar burning. She is otherwise doing well postop.   Plan:            1.  Nuswab performed  2.  Urine sent for culture and sensitivity Patient may resume normal activities with exception of heavy lifting. Orders Orders Placed This Encounter  Procedures   Urine Culture    No orders of the defined types were placed in this encounter.     F/U  Return in about 3 months (around 04/01/2022) for We will contact her with any abnormal test results.  Finis Bud, M.D. 01/02/2022 2:22 PM

## 2022-01-02 NOTE — Progress Notes (Signed)
Patient presents for 6 week follow up post hysterectomy. Patient reports no pain or bleeding at this time. Patient states as of 2/14 having a constant burning sensation. Patient states no painful urination, no back pain or dark urine. Patient states no other questions or concerns.

## 2022-01-05 LAB — URINE CULTURE: Organism ID, Bacteria: NO GROWTH

## 2022-01-06 LAB — CERVICOVAGINAL ANCILLARY ONLY
Bacterial Vaginitis (gardnerella): NEGATIVE
Candida Glabrata: NEGATIVE
Candida Vaginitis: NEGATIVE
Comment: NEGATIVE
Comment: NEGATIVE
Comment: NEGATIVE

## 2022-01-13 NOTE — Progress Notes (Signed)
Tammy Todd: There are no abnormalities noted on your urine or your vaginal cultures. This is good news.  My expectation is that your symptoms will soon resolve.

## 2022-01-14 NOTE — Progress Notes (Signed)
Forwarding recommendation to patient via mychart

## 2022-04-03 ENCOUNTER — Encounter: Payer: BC Managed Care – PPO | Admitting: Obstetrics and Gynecology

## 2022-04-03 DIAGNOSIS — Z9889 Other specified postprocedural states: Secondary | ICD-10-CM

## 2022-05-01 ENCOUNTER — Inpatient Hospital Stay: Payer: BC Managed Care – PPO

## 2022-05-01 ENCOUNTER — Other Ambulatory Visit: Payer: Self-pay

## 2022-05-01 ENCOUNTER — Inpatient Hospital Stay: Payer: BC Managed Care – PPO | Attending: Oncology | Admitting: Oncology

## 2022-05-01 ENCOUNTER — Encounter: Payer: Self-pay | Admitting: Oncology

## 2022-05-01 VITALS — BP 124/78 | HR 59 | Temp 97.2°F | Resp 18 | Wt 134.8 lb

## 2022-05-01 DIAGNOSIS — D500 Iron deficiency anemia secondary to blood loss (chronic): Secondary | ICD-10-CM

## 2022-05-01 DIAGNOSIS — Z79899 Other long term (current) drug therapy: Secondary | ICD-10-CM | POA: Insufficient documentation

## 2022-05-01 DIAGNOSIS — D509 Iron deficiency anemia, unspecified: Secondary | ICD-10-CM | POA: Insufficient documentation

## 2022-05-01 LAB — CBC WITH DIFFERENTIAL/PLATELET
Abs Immature Granulocytes: 0.01 10*3/uL (ref 0.00–0.07)
Basophils Absolute: 0 10*3/uL (ref 0.0–0.1)
Basophils Relative: 1 %
Eosinophils Absolute: 0.1 10*3/uL (ref 0.0–0.5)
Eosinophils Relative: 2 %
HCT: 38.9 % (ref 36.0–46.0)
Hemoglobin: 12.1 g/dL (ref 12.0–15.0)
Immature Granulocytes: 0 %
Lymphocytes Relative: 36 %
Lymphs Abs: 1.6 10*3/uL (ref 0.7–4.0)
MCH: 28.4 pg (ref 26.0–34.0)
MCHC: 31.1 g/dL (ref 30.0–36.0)
MCV: 91.3 fL (ref 80.0–100.0)
Monocytes Absolute: 0.5 10*3/uL (ref 0.1–1.0)
Monocytes Relative: 10 %
Neutro Abs: 2.3 10*3/uL (ref 1.7–7.7)
Neutrophils Relative %: 51 %
Platelets: 205 10*3/uL (ref 150–400)
RBC: 4.26 MIL/uL (ref 3.87–5.11)
RDW: 14.5 % (ref 11.5–15.5)
WBC: 4.6 10*3/uL (ref 4.0–10.5)
nRBC: 0 % (ref 0.0–0.2)

## 2022-05-01 LAB — FERRITIN: Ferritin: 26 ng/mL (ref 11–307)

## 2022-05-01 LAB — IRON AND TIBC
Iron: 85 ug/dL (ref 28–170)
Saturation Ratios: 19 % (ref 10.4–31.8)
TIBC: 438 ug/dL (ref 250–450)
UIBC: 353 ug/dL

## 2022-05-01 NOTE — Progress Notes (Signed)
Hematology/Oncology progress  note [facility] [phone] [phone]   Patient Care Team: Lars Masson as PCP - General (Physician Assistant)  REFERRING PROVIDER: Gayland Curry, MD CHIEF COMPLAINTS/REASON FOR VISIT:   iron deficiency anemia  HISTORY OF PRESENTING ILLNESS:  Tammy Todd is a  41 y.o.  female with PMH listed below was seen in consultation at the request of Gayland Curry, MD   for evaluation of iron deficiency anemia.   Reviewed patient's recent labs  05/07/2020 [facility] CBC showed hemoglobin 10.7, MCV 73, RDW 16.5, WBC 6.9, normal differential platelet count 427,000.  Creatinine 0.88 sodium 141, potassium 4.2, calcium 9.2, albumin 4.3, bilirubin 1.3, copper 640 MCG/l, folate 14.3, vitamin D 25 hydroxy 38.3, vitamin K one 0.05 vitamin B12 more than 2000, zinc 85, ferritin 5, iron 37 Reviewed patient's previous labs ordered by primary care physician's office, anemia is chronic onset  No aggravating or improving factors.  Associated signs and symptoms: Patient reports fatigue.  SOB with exertion.  Denies weight loss, easy bruising, hematochezia, hemoptysis, hematuria. Context:  History of iron deficiency: Longstanding history of iron deficiency.  Patient takes oral iron supplementation ferrous sulfate 325 mg twice daily. Rectal bleeding: Denies Menstrual bleeding/ Vaginal bleeding : Heavy menstrual bleeding Hematemesis or hemoptysis : denies Blood in urine : denies  History of bariatric surgery.  INTERVAL HISTORY Tammy Todd is a 41 y.o. female who has above history reviewed by me today presents for follow up visit for management of iron deficiency anemia Patient reports feeling well She has had hysterectomy done in January 2023. Fatigue level has improved.    Review of Systems  Constitutional:  Negative for appetite change, chills, fatigue and fever.  HENT:   Negative for  hearing loss and voice change.   Eyes:  Negative for eye problems.  Respiratory:  Negative for chest tightness and cough.   Cardiovascular:  Negative for chest pain.  Gastrointestinal:  Negative for abdominal distention, abdominal pain and blood in stool.  Endocrine: Negative for hot flashes.  Genitourinary:  Negative for difficulty urinating, frequency and menstrual problem.   Musculoskeletal:  Negative for arthralgias.  Skin:  Negative for itching and rash.  Neurological:  Negative for extremity weakness.  Hematological:  Negative for adenopathy.  Psychiatric/Behavioral:  Negative for confusion.     MEDICAL HISTORY:  Past Medical History:  Diagnosis Date   Abnormal weight gain 11/03/2018   Anemia    Anxiety    Class 3 severe obesity due to excess calories with serious comorbidity and body mass index (BMI) of 45.0 to 49.9 in adult ([place]) 11/03/2018   Depression    GERD (gastroesophageal reflux disease)    H. pylori infection    Hiatal hernia    Iron deficiency 04/03/2015   Scoliosis     SURGICAL HISTORY: Past Surgical History:  Procedure Laterality Date   BARIATRIC SURGERY     COLONOSCOPY WITH PROPOFOL N/A 09/22/2019   Procedure: COLONOSCOPY WITH PROPOFOL;  Surgeon: Toledo, Benay Pike, MD;  Location: [facility] ENDOSCOPY;  Service: Endoscopy;  Laterality: N/A;   ESOPHAGOGASTRODUODENOSCOPY (EGD) WITH PROPOFOL N/A 09/22/2019   Procedure: ESOPHAGOGASTRODUODENOSCOPY (EGD) WITH PROPOFOL;  Surgeon: Toledo, Benay Pike, MD;  Location: [facility] ENDOSCOPY;  Service: Endoscopy;  Laterality: N/A;   GASTROPLASTY DUODENAL SWITCH  10/24/2019   HIATAL HERNIA REPAIR  10/24/2019   LAPAROSCOPIC ASSISTED VAGINAL HYSTERECTOMY N/A 11/22/2021   Procedure: LAPAROSCOPIC ASSISTED VAGINAL HYSTERECTOMY AND BILATERAL SALPHINGECTOMY;  Surgeon: Harlin Heys, MD;  Location: [facility] ORS;  Service: Gynecology;  Laterality: N/A;    SOCIAL HISTORY: Social History   Socioeconomic History   Marital status: Married     Spouse name: Not on file   Number of children: Not on file   Years of education: Not on file   Highest education level: Not on file  Occupational History   Not on file  Tobacco Use   Smoking status: Former    Packs/day: 0.25    Years: 9.00    Total pack years: 2.25    Types: Cigarettes    Quit date: 12/2009    Years since quitting: 12.3   Smokeless tobacco: Never  Vaping Use   Vaping Use: Never used  Substance and Sexual Activity   Alcohol use: Not Currently    Comment: social   Drug use: No   Sexual activity: Not Currently    Birth control/protection: None  Other Topics Concern   Not on file  Social History Narrative   Not on file   Social Determinants of Health   Financial Resource Strain: Not on file  Food Insecurity: Not on file  Transportation Needs: Not on file  Physical Activity: Not on file  Stress: Not on file  Social Connections: Not on file  Intimate Partner Violence: Not on file    FAMILY HISTORY: Family History  Problem Relation Age of Onset   Diabetes Mother    Hyperlipidemia Mother    Hypertension Mother    Alcohol abuse Father    Hypertension Father    Obesity Father    Hyperlipidemia Father    Diabetes Father    Depression Sister    Anxiety disorder Sister    Diabetes Sister    Diabetes Brother    Hyperlipidemia Brother    Hypertension Brother    Obesity Paternal Grandmother    Breast cancer Paternal Aunt    Stomach cancer Paternal Uncle     ALLERGIES:  is allergic to cashew nut oil, penicillins, and pistachio nut extract skin test.  MEDICATIONS:  Current Outpatient Medications  Medication Sig Dispense Refill   acetaminophen (TYLENOL) 500 MG tablet Take 1,000 mg by mouth daily.     Calcium Carbonate (CALCIUM 500 PO) Take 500 mg by mouth 4 (four) times daily.     cholecalciferol (VITAMIN D3) 25 MCG (1000 UNIT) tablet Take 1,000 Units by mouth daily.     gabapentin (NEURONTIN) 300 MG capsule Take 300 mg by mouth daily.     Multiple  Vitamin (MULTIVITAMIN WITH MINERALS) TABS tablet Take 1 tablet by mouth in the morning and at bedtime.     VITAMIN E PO Take by mouth.     calcium carbonate (TUMS - DOSED IN MG ELEMENTAL CALCIUM) 500 MG chewable tablet Chew 1 tablet by mouth as needed for indigestion or heartburn. (Patient not taking: Reported on 05/01/2022)     ferrous sulfate 325 (65 FE) MG tablet Take 1 tablet (325 mg total) by mouth 2 (two) times daily with a meal. (Patient not taking: Reported on 05/01/2022) 60 tablet 2   No current facility-administered medications for this visit.     PHYSICAL EXAMINATION: ECOG PERFORMANCE STATUS: 0 - Asymptomatic Vitals:   05/01/22 1011  BP: 124/78  Pulse: (!) 59  Resp: 18  Temp: (!) 97.2 F (36.2 C)   Filed Weights   05/01/22 1011  Weight: 134 lb 12.8 oz (61.1 kg)    Physical Exam Constitutional:      General: She is not in acute  distress. HENT:     Head: Normocephalic and atraumatic.  Eyes:     General: No scleral icterus. Cardiovascular:     Rate and Rhythm: Normal rate.  Pulmonary:     Effort: Pulmonary effort is normal. No respiratory distress.  Abdominal:     General: There is no distension.  Musculoskeletal:        General: Normal range of motion.     Cervical back: Normal range of motion and neck supple.  Skin:    General: Skin is warm and dry.     Findings: No erythema or rash.  Neurological:     Mental Status: She is alert and oriented to person, place, and time. Mental status is at baseline.  Psychiatric:        Mood and Affect: Mood normal.          Latest Ref Rng & Units 06/21/2021   11:46 AM  CMP  Glucose 70 - 99 mg/dL 87   BUN 6 - 20 mg/dL 13   Creatinine 0.44 - 1.00 mg/dL 0.63   Sodium 135 - 145 mmol/L 138   Potassium 3.5 - 5.1 mmol/L 3.9   Chloride 98 - 111 mmol/L 109   CO2 22 - 32 mmol/L 23   Calcium 8.9 - 10.3 mg/dL 8.6   Total Protein 6.5 - 8.1 g/dL 6.8   Total Bilirubin 0.3 - 1.2 mg/dL 0.5   Alkaline Phos 38 - 126 U/L 73   AST  15 - 41 U/L 21   ALT 0 - 44 U/L 15       Latest Ref Rng & Units 05/01/2022    9:56 AM  CBC  WBC 4.0 - 10.5 K/uL 4.6   Hemoglobin 12.0 - 15.0 g/dL 12.1   Hematocrit 36.0 - 46.0 % 38.9   Platelets 150 - 400 K/uL 205      LABORATORY DATA:  I have reviewed the data as listed Lab Results  Component Value Date   WBC 4.6 05/01/2022   HGB 12.1 05/01/2022   HCT 38.9 05/01/2022   MCV 91.3 05/01/2022   PLT 205 05/01/2022   Recent Labs    06/21/21 1146  NA 138  K 3.9  CL 109  CO2 23  GLUCOSE 87  BUN 13  CREATININE 0.63  CALCIUM 8.6*  GFRNONAA >60  PROT 6.8  ALBUMIN 4.2  AST 21  ALT 15  ALKPHOS 73  BILITOT 0.5    Iron/TIBC/Ferritin/ %Sat    Component Value Date/Time   IRON 85 05/01/2022 0956   TIBC 438 05/01/2022 0956   FERRITIN 26 05/01/2022 0956   IRONPCTSAT 19 05/01/2022 0956     RADIOGRAPHIC STUDIES: I have personally reviewed the radiological images as listed and agreed with the findings in the report. No results found.     ASSESSMENT & PLAN:  1. Iron deficiency anemia due to chronic blood loss    Iron deficiency anemia due to menorrhagia.  Status post hysterectomy. Labs reviewed and discussed with patient. Hemoglobin and iron panel have both normalized.  No need for additional IV Venofer at this point.  History of bariatric surgeries, continue multi vitamins.  She will be discharged from our clinic today. Recommend patient to continue follow-up with primary care provider.  She may reestablish care with hematology in the future if needed.  No orders of the defined types were placed in this encounter.   All questions were answered. The patient knows to call the clinic with any problems questions or concerns.  Cc Gayland Curry, MD  Earlie Server, MD, PhD Hematology Oncology  05/01/2022

## 2022-05-01 NOTE — Progress Notes (Signed)
Pt here for follow up. No new concerns voiced.   

## 2022-06-19 ENCOUNTER — Other Ambulatory Visit: Payer: Self-pay | Admitting: Physical Medicine & Rehabilitation

## 2022-06-19 DIAGNOSIS — G8929 Other chronic pain: Secondary | ICD-10-CM

## 2022-06-25 ENCOUNTER — Ambulatory Visit
Admission: RE | Admit: 2022-06-25 | Discharge: 2022-06-25 | Disposition: A | Discharge status: Home / Self Care | Payer: BC Managed Care – PPO | Source: Ambulatory Visit | Attending: Physical Medicine & Rehabilitation | Admitting: Physical Medicine & Rehabilitation

## 2022-06-25 DIAGNOSIS — M5441 Lumbago with sciatica, right side: Secondary | ICD-10-CM | POA: Insufficient documentation

## 2022-06-25 DIAGNOSIS — G8929 Other chronic pain: Secondary | ICD-10-CM | POA: Diagnosis present

## 2022-07-17 ENCOUNTER — Encounter: Payer: BC Managed Care – PPO | Admitting: Family Medicine

## 2022-07-31 ENCOUNTER — Ambulatory Visit (INDEPENDENT_AMBULATORY_CARE_PROVIDER_SITE_OTHER): Payer: BC Managed Care – PPO | Admitting: Family Medicine

## 2022-07-31 ENCOUNTER — Encounter: Payer: Self-pay | Admitting: Family Medicine

## 2022-07-31 ENCOUNTER — Ambulatory Visit: Payer: Self-pay | Admitting: Podiatry

## 2022-07-31 VITALS — BP 110/70 | HR 70 | Ht 63.0 in | Wt 134.2 lb

## 2022-07-31 DIAGNOSIS — M7712 Lateral epicondylitis, left elbow: Secondary | ICD-10-CM | POA: Diagnosis not present

## 2022-07-31 DIAGNOSIS — M654 Radial styloid tenosynovitis [de Quervain]: Secondary | ICD-10-CM

## 2022-07-31 MED ORDER — CELECOXIB 100 MG PO CAPS: 100.0000 mg | ORAL_CAPSULE | Freq: Two times a day (BID) | ORAL | 0 refills | Status: DC

## 2022-07-31 NOTE — Assessment & Plan Note (Signed)
See additional assessment(s) for history details.  Left-hand-dominant patient with left radial wrist/first digit pain with ADLs.  Examination shows tenderness at the radial styloid and just distal to this, positive Finkelstein test, otherwise sensorimotor intact, negative Phalen's, negative Tinel's, nontender about the first [facility] or MCP otherwise, no triggering.  Patient's clinical history and findings are most consistent with De Quervain's tenosynovitis, has been using thumb spica brace sporadically, did discuss the need for consistent usage, will start Celebrex 100 mg twice daily, and close follow-up with low threshold to advance to ultrasound-guided corticosteroid injection.  Given the nature of her work related requirements, brace usage can be understandably limited.

## 2022-07-31 NOTE — Progress Notes (Signed)
Primary Care / Sports Medicine Office Visit  Patient Information:  Patient ID: Sehar Sedano, female DOB: 10/19/1981 Age: 41 y.o. MRN: [id number]   Tammy Todd is a pleasant 41 y.o. female presenting with the following:  Chief Complaint  Patient presents with   Tennis elbow and Tenosynovitis    Left side, for 10 months, no known injury, no imaging. Rest helps. Left handed.     Vitals:   07/31/22 1036  BP: 110/70  Pulse: 70  SpO2: 98%   Vitals:   07/31/22 1036  Weight: 134 lb 3.2 oz (60.9 kg)  Height: '5\' 3"'$  (1.6 m)   Body mass index is 23.77 kg/m.  No results found.   Independent interpretation of notes and tests performed by another provider:   None  Procedures performed:   None  Pertinent History, Exam, Impression, and Recommendations:   Problem List Items Addressed This Visit       Musculoskeletal and Integument   Left lateral epicondylitis - Primary    Left-hand-dominant patient with several month history of atraumatic left posterolateral elbow pain.  She states that during the 10/2021 timeframe, went to play a driving range, had onset of symptoms thereafter, due to separate medical condition, had some downtime from work where symptoms somewhat subsided, after returning to work symptoms recurred.  Denies any paresthesias, no radiation proximally, aggravated by ADLs.  Provocative testing examination reveals limited extension when compared to contralateral, due to pain, full painful flexion that is symmetric, pronation/supination are painful as well localizing to the posterolateral left elbow.  Tenderness at the lateral epicondyle, resisted extension at the wrist and resisted supination elicits severe pain.  Her clinical history and findings are most consistent with left lateral epicondylitis, at this stage we discussed the nature of this condition, risk factors, and treatment options.  She will start scheduled Celebrex and return for close  follow-up with low threshold to advance to ultrasound-guided corticosteroid injection.  She understands that once symptoms are adequately controlled, she would benefit from home-based versus formal PT. Given symptom onset I do anticipate this condition to last at least 1 year from onset.      Relevant Medications   tiZANidine (ZANAFLEX) 2 MG tablet   oxyCODONE (OXY IR/ROXICODONE) 5 MG immediate release tablet   celecoxib (CELEBREX) 100 MG capsule   De Quervain's tenosynovitis, left    See additional assessment(s) for history details.  Left-hand-dominant patient with left radial wrist/first digit pain with ADLs.  Examination shows tenderness at the radial styloid and just distal to this, positive Finkelstein test, otherwise sensorimotor intact, negative Phalen's, negative Tinel's, nontender about the first [facility] or MCP otherwise, no triggering.  Patient's clinical history and findings are most consistent with De Quervain's tenosynovitis, has been using thumb spica brace sporadically, did discuss the need for consistent usage, will start Celebrex 100 mg twice daily, and close follow-up with low threshold to advance to ultrasound-guided corticosteroid injection.  Given the nature of her work related requirements, brace usage can be understandably limited.      Relevant Medications   celecoxib (CELEBREX) 100 MG capsule     Orders & Medications Meds ordered this encounter  Medications   celecoxib (CELEBREX) 100 MG capsule    Sig: Take 1 capsule (100 mg total) by mouth 2 (two) times daily.    Dispense:  60 capsule    Refill:  0   No orders of the defined types were placed in this encounter.  No follow-ups on file.     Montel Culver, MD   Primary Care Sports Medicine [place]

## 2022-07-31 NOTE — Patient Instructions (Signed)
-   Dose Celebrex every a.m. and every p.m. scheduled (take with food), no other NSAIDs while this medication - Use thumb spica brace while active whenever possible, okay to remove for hand hygiene, sleeping, periods of prolonged rest - Conduct gentle activity as tolerated using elbow and hand/wrist symptoms as a guide - Return for follow-up once scheduled, contact us for any questions between now and then

## 2022-07-31 NOTE — Assessment & Plan Note (Addendum)
Left-hand-dominant patient with several month history of atraumatic left posterolateral elbow pain.  She states that during the 10/2021 timeframe, went to play a driving range, had onset of symptoms thereafter, due to separate medical condition, had some downtime from work where symptoms somewhat subsided, after returning to work symptoms recurred.  Denies any paresthesias, no radiation proximally, aggravated by ADLs.  Provocative testing examination reveals limited extension when compared to contralateral, due to pain, full painful flexion that is symmetric, pronation/supination are painful as well localizing to the posterolateral left elbow.  Tenderness at the lateral epicondyle, resisted extension at the wrist and resisted supination elicits severe pain.  Her clinical history and findings are most consistent with left lateral epicondylitis, at this stage we discussed the nature of this condition, risk factors, and treatment options.  She will start scheduled Celebrex and return for close follow-up with low threshold to advance to ultrasound-guided corticosteroid injection.  She understands that once symptoms are adequately controlled, she would benefit from home-based versus formal PT. Given symptom onset I do anticipate this condition to last at least 1 year from onset.

## 2022-08-12 ENCOUNTER — Encounter: Payer: Self-pay | Admitting: Family Medicine

## 2022-08-12 ENCOUNTER — Inpatient Hospital Stay (INDEPENDENT_AMBULATORY_CARE_PROVIDER_SITE_OTHER): Payer: BC Managed Care – PPO | Admitting: Radiology

## 2022-08-12 ENCOUNTER — Ambulatory Visit (INDEPENDENT_AMBULATORY_CARE_PROVIDER_SITE_OTHER): Payer: BC Managed Care – PPO | Admitting: Family Medicine

## 2022-08-12 VITALS — BP 122/80 | HR 78 | Ht 63.0 in | Wt 134.0 lb

## 2022-08-12 DIAGNOSIS — M7712 Lateral epicondylitis, left elbow: Secondary | ICD-10-CM

## 2022-08-12 DIAGNOSIS — M654 Radial styloid tenosynovitis [de Quervain]: Secondary | ICD-10-CM

## 2022-08-12 MED ORDER — TRIAMCINOLONE ACETONIDE 40 MG/ML IJ SUSP
80.0000 mg | Freq: Once | INTRAMUSCULAR | Status: AC
  Administered 2022-08-12: 80 mg via INTRAMUSCULAR

## 2022-08-12 MED ORDER — TRIAMCINOLONE ACETONIDE 40 MG/ML IJ SUSP: 40.0000 mg | Freq: Once | INTRAMUSCULAR | Status: DC

## 2022-08-12 MED ORDER — CELECOXIB 200 MG PO CAPS: 200.0000 mg | ORAL_CAPSULE | Freq: Two times a day (BID) | ORAL | 0 refills | Status: AC | PRN

## 2022-08-12 NOTE — Progress Notes (Signed)
Primary Care / Sports Medicine Office Visit  Patient Information:  Patient ID: Tammy Todd, female DOB: 21-Jun-1981 Age: 41 y.o. MRN: [id number]   Tammy Todd is a pleasant 41 y.o. female presenting with the following:  Chief Complaint  Patient presents with   Left lateral epicondylitis    "Feels the same" medication has help some   Wrist Pain    Left "Feels the same" medication has help some    Vitals:   08/12/22 1324  BP: 122/80  Pulse: 78  SpO2: 99%   Vitals:   08/12/22 1324  Weight: 134 lb (60.8 kg)  Height: '5\' 3"'$  (1.6 m)   Body mass index is 23.74 kg/m.  No results found.   Independent interpretation of notes and tests performed by another provider:   None  Procedures performed:   Procedure:  Injection of left radial styloid under ultrasound guidance. Ultrasound guidance utilized for out of plane injection to the left radial styloid, visualized tendon demonstrates subtle thickening sonographically Samsung HS60 device utilized with permanent recording / reporting. Verbal informed consent obtained and verified. Skin prepped in a sterile fashion. Ethyl chloride for topical local analgesia.  Completed without difficulty and tolerated well. Medication: triamcinolone acetonide 40 mg/mL suspension for injection 1 mL total and 2 mL lidocaine 1% without epinephrine utilized for needle placement anesthetic Advised to contact for fevers/chills, erythema, induration, drainage, or persistent bleeding.  Procedure:  Injection of left lateral epicondyle under ultrasound guidance. Ultrasound guidance utilized for out of plane approach to the left lateral epicondyle, no sonographic evidence of tendinopathy appreciated Samsung HS60 device utilized with permanent recording / reporting. Verbal informed consent obtained and verified. Skin prepped in a sterile fashion. Ethyl chloride for topical local analgesia.  Completed without difficulty and  tolerated well. Medication: triamcinolone acetonide 40 mg/mL suspension for injection 1 mL total and 2 mL lidocaine 1% without epinephrine utilized for needle placement anesthetic Advised to contact for fevers/chills, erythema, induration, drainage, or persistent bleeding.   Pertinent History, Exam, Impression, and Recommendations:   Problem List Items Addressed This Visit       Musculoskeletal and Integument   Left lateral epicondylitis - Primary    Patient returns for follow-up to left lateral epicondylitis, at last visit on 07/31/2022 was advised scheduled Celebrex 100 mg, relative rest, and close follow-up.  While she does endorse limited improvement with Celebrex, she still is symptomatic, provocative testing is consistent with lateral epicondylitis.  As such, we discussed additional treatment strategies and she did elect to proceed with ultrasound-guided left lateral epicondylitis cortisone injection.  She tolerated this well and post care was reviewed. I also discussed transition to Celebrex 200 mg twice daily as needed until symptoms respond to cortisone as well as a start of a home-based rehab program with materials provided today.  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      Relevant Medications   celecoxib (CELEBREX) 200 MG capsule   Other Relevant Orders   [place] LIMITED JOINT SPACE STRUCTURES UP LEFT   De Quervain's tenosynovitis, left    Patient also returns for follow-up to left De Quervain's tenosynovitis, at the last visit on 07/31/2022 advised her to utilize thumb spica brace consistently, dose Celebrex 100 mg twice daily, and despite subtle improvement from Celebrex, she is still symptomatic.  Provocative testing is again consistent with De Quervain's tenosynovitis, findings unchanged.  We discussed additional treatment strategies and  she did elect to proceed with ultrasound-guided corticosteroid injection, post care  reviewed, transition to Celebrex 200 mg twice daily as needed and home-based rehab was advised.  She can follow-up as needed.      Relevant Medications   celecoxib (CELEBREX) 200 MG capsule   Other Relevant Orders   [place] LIMITED JOINT SPACE STRUCTURES UP LEFT     Orders & Medications Meds ordered this encounter  Medications   celecoxib (CELEBREX) 200 MG capsule    Sig: Take 1 capsule (200 mg total) by mouth 2 (two) times daily as needed.    Dispense:  60 capsule    Refill:  0   DISCONTD: triamcinolone acetonide (KENALOG-40) injection 40 mg   triamcinolone acetonide (KENALOG-40) injection 80 mg   Orders Placed This Encounter  Procedures   [place] LIMITED JOINT SPACE STRUCTURES UP LEFT     No follow-ups on file.     Montel Culver, MD   Primary Care Sports Medicine [place]

## 2022-08-12 NOTE — Patient Instructions (Addendum)
You have just been given a cortisone injection to reduce pain and inflammation. After the injection you may notice immediate relief of pain as a result of the Lidocaine. It is important to rest the area of the injection for 24 to 48 hours after the injection. There is a possibility of some temporary increased discomfort and swelling for up to 72 hours until the cortisone begins to work. If you do have pain, simply rest the joint and use ice. If you can tolerate over the counter medications, you can try Tylenol for added relief per package instructions. - As above, relative rest x2 days and gradual return normal activity - Ice x20 minutes when home, before bed, and as needed - Can dose Celebrex 200 mg twice daily on an as-needed basis until symptoms respond to cortisone - Start home exercises for the wrist and elbow this weekend and continue to advance these - Remain out of work until Monday, may return to work sooner if symptoms permit - Contact us for any questions and follow-up as needed

## 2022-08-12 NOTE — Assessment & Plan Note (Signed)
Patient also returns for follow-up to left De Quervain's tenosynovitis, at the last visit on 07/31/2022 advised her to utilize thumb spica brace consistently, dose Celebrex 100 mg twice daily, and despite subtle improvement from Celebrex, she is still symptomatic.  Provocative testing is again consistent with De Quervain's tenosynovitis, findings unchanged.  We discussed additional treatment strategies and she did elect to proceed with ultrasound-guided corticosteroid injection, post care reviewed, transition to Celebrex 200 mg twice daily as needed and home-based rehab was advised.  She can follow-up as needed.

## 2022-08-12 NOTE — Assessment & Plan Note (Signed)
Patient returns for follow-up to left lateral epicondylitis, at last visit on 07/31/2022 was advised scheduled Celebrex 100 mg, relative rest, and close follow-up.  While she does endorse limited improvement with Celebrex, she still is symptomatic, provocative testing is consistent with lateral epicondylitis.  As such, we discussed additional treatment strategies and she did elect to proceed with ultrasound-guided left lateral epicondylitis cortisone injection.  She tolerated this well and post care was reviewed. I also discussed transition to Celebrex 200 mg twice daily as needed until symptoms respond to cortisone as well as a start of a home-based rehab program with materials provided today.  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

## 2022-08-28 ENCOUNTER — Other Ambulatory Visit: Payer: Self-pay | Admitting: Family Medicine

## 2022-08-28 DIAGNOSIS — M7712 Lateral epicondylitis, left elbow: Secondary | ICD-10-CM

## 2022-08-28 DIAGNOSIS — M654 Radial styloid tenosynovitis [de Quervain]: Secondary | ICD-10-CM

## 2022-08-28 NOTE — Telephone Encounter (Signed)
Requested medication (s) are due for refill today: unsure  Requested medication (s) are on the active medication list: yes  Last refill:  08/12/22  Future visit scheduled: no  Notes to clinic:  Unable to refill per protocol, PCP is not at this practice, routing for review     Requested Prescriptions  Pending Prescriptions Disp Refills   celecoxib (CELEBREX) 100 MG capsule [Pharmacy Med Name: CELECOXIB 100 MG CAPSULE] 60 capsule 0    Sig: TAKE 1 CAPSULE BY MOUTH TWICE A DAY     Analgesics:  COX2 Inhibitors Failed - 08/28/2022  3:21 AM      Failed - Manual Review: Labs are only required if the patient has taken medication for more than 8 weeks.      Failed - Cr in normal range and within 360 days    Creatinine, Ser  Date Value Ref Range Status  06/21/2021 0.63 0.44 - 1.00 mg/dL Final         Failed - AST in normal range and within 360 days    AST  Date Value Ref Range Status  06/21/2021 21 15 - 41 U/L Final         Failed - ALT in normal range and within 360 days    ALT  Date Value Ref Range Status  06/21/2021 15 0 - 44 U/L Final         Failed - eGFR is 30 or above and within 360 days    GFR calc Af Amer  Date Value Ref Range Status  11/15/2019 >60 >60 mL/min Final   GFR, Estimated  Date Value Ref Range Status  06/21/2021 >60 >60 mL/min Final    Comment:    (NOTE) Calculated using the CKD-EPI Creatinine Equation (2021)          Failed - Valid encounter within last 12 months    Recent Outpatient Visits           2 weeks ago Left lateral epicondylitis   [facility] [place], Earley Abide, MD   4 weeks ago Left lateral epicondylitis   [facility], Earley Abide, MD              Passed - HGB in normal range and within 360 days    Hemoglobin  Date Value Ref Range Status  05/01/2022 12.1 12.0 - 15.0 g/dL Final         Passed - HCT in normal range and  within 360 days    HCT  Date Value Ref Range Status  05/01/2022 38.9 36.0 - 46.0 % Final         Passed - Patient is not pregnant

## 2022-08-28 NOTE — Telephone Encounter (Signed)
Refill

## 2022-08-29 NOTE — Telephone Encounter (Signed)
Called patient to review sx per Dr. Zigmund Daniel request to see if further refills needed or to f/u for evaluation

## 2022-08-29 NOTE — Telephone Encounter (Signed)
Called pt left VM to call back.  KP 

## 2022-08-29 NOTE — Telephone Encounter (Signed)
Requested medication (s) are due for refill today: na  Requested medication (s) are on the active medication list: yes   Last refill:  08/12/22 #60 0 refills  Future visit scheduled: no  Notes to clinic:  attempted to contact patient regarding sx. No answer, LVMTCB . Do you want to refill Rx?     Requested Prescriptions  Pending Prescriptions Disp Refills   celecoxib (CELEBREX) 100 MG capsule [Pharmacy Med Name: CELECOXIB 100 MG CAPSULE] 60 capsule 0    Sig: TAKE 1 CAPSULE BY MOUTH TWICE A DAY     Analgesics:  COX2 Inhibitors Failed - 08/28/2022  3:21 AM      Failed - Manual Review: Labs are only required if the patient has taken medication for more than 8 weeks.      Failed - Cr in normal range and within 360 days    Creatinine, Ser  Date Value Ref Range Status  06/21/2021 0.63 0.44 - 1.00 mg/dL Final         Failed - AST in normal range and within 360 days    AST  Date Value Ref Range Status  06/21/2021 21 15 - 41 U/L Final         Failed - ALT in normal range and within 360 days    ALT  Date Value Ref Range Status  06/21/2021 15 0 - 44 U/L Final         Failed - eGFR is 30 or above and within 360 days    GFR calc Af Amer  Date Value Ref Range Status  11/15/2019 >60 >60 mL/min Final   GFR, Estimated  Date Value Ref Range Status  06/21/2021 >60 >60 mL/min Final    Comment:    (NOTE) Calculated using the CKD-EPI Creatinine Equation (2021)          Failed - Valid encounter within last 12 months    Recent Outpatient Visits           2 weeks ago Left lateral epicondylitis   [facility] [place], Earley Abide, MD   4 weeks ago Left lateral epicondylitis   [facility], Earley Abide, MD              Passed - HGB in normal range and within 360 days    Hemoglobin  Date Value Ref Range Status  05/01/2022 12.1 12.0 - 15.0 g/dL Final         Passed -  HCT in normal range and within 360 days    HCT  Date Value Ref Range Status  05/01/2022 38.9 36.0 - 46.0 % Final         Passed - Patient is not pregnant

## 2022-09-14 ENCOUNTER — Other Ambulatory Visit: Payer: Self-pay | Admitting: Family Medicine

## 2022-09-14 DIAGNOSIS — M7712 Lateral epicondylitis, left elbow: Secondary | ICD-10-CM

## 2022-09-14 DIAGNOSIS — M654 Radial styloid tenosynovitis [de Quervain]: Secondary | ICD-10-CM

## 2022-09-15 NOTE — Telephone Encounter (Signed)
Requested medications are due for refill today.  unsure  Requested medications are on the active medications list.  yes  Last refill. 08/12/2022 #60 0 rf  Future visit scheduled.   no  Notes to clinic.  Mychal Claiborne Billings listed as PCP.    Requested Prescriptions  Pending Prescriptions Disp Refills   celecoxib (CELEBREX) 200 MG capsule [Pharmacy Med Name: CELECOXIB 200 MG CAPSULE] 60 capsule 0    Sig: Take 1 capsule (200 mg total) by mouth 2 (two) times daily as needed.     Analgesics:  COX2 Inhibitors Failed - 09/14/2022  9:36 AM      Failed - Manual Review: Labs are only required if the patient has taken medication for more than 8 weeks.      Failed - Cr in normal range and within 360 days    Creatinine, Ser  Date Value Ref Range Status  06/21/2021 0.63 0.44 - 1.00 mg/dL Final         Failed - AST in normal range and within 360 days    AST  Date Value Ref Range Status  06/21/2021 21 15 - 41 U/L Final         Failed - ALT in normal range and within 360 days    ALT  Date Value Ref Range Status  06/21/2021 15 0 - 44 U/L Final         Failed - eGFR is 30 or above and within 360 days    GFR calc Af Amer  Date Value Ref Range Status  11/15/2019 >60 >60 mL/min Final   GFR, Estimated  Date Value Ref Range Status  06/21/2021 >60 >60 mL/min Final    Comment:    (NOTE) Calculated using the CKD-EPI Creatinine Equation (2021)          Failed - Valid encounter within last 12 months    Recent Outpatient Visits           1 month ago Left lateral epicondylitis   [facility] [place], Earley Abide, MD   1 month ago Left lateral epicondylitis   [facility], Earley Abide, MD              Passed - HGB in normal range and within 360 days    Hemoglobin  Date Value Ref Range Status  05/01/2022 12.1 12.0 - 15.0 g/dL Final         Passed - HCT in normal range and within  360 days    HCT  Date Value Ref Range Status  05/01/2022 38.9 36.0 - 46.0 % Final         Passed - Patient is not pregnant

## 2022-09-16 NOTE — Telephone Encounter (Signed)
Please review.  KP

## 2022-09-17 ENCOUNTER — Ambulatory Visit
Admission: EM | Admit: 2022-09-17 | Discharge: 2022-09-17 | Disposition: A | Discharge status: Home / Self Care | Payer: BC Managed Care – PPO | Attending: Physician Assistant | Admitting: Physician Assistant

## 2022-09-17 DIAGNOSIS — J069 Acute upper respiratory infection, unspecified: Secondary | ICD-10-CM | POA: Diagnosis not present

## 2022-09-17 DIAGNOSIS — J029 Acute pharyngitis, unspecified: Secondary | ICD-10-CM

## 2022-09-17 DIAGNOSIS — R051 Acute cough: Secondary | ICD-10-CM

## 2022-09-17 LAB — GROUP A STREP BY PCR: Group A Strep by PCR: NOT DETECTED

## 2022-09-17 MED ORDER — PROMETHAZINE-DM 6.25-15 MG/5ML PO SYRP: 5.0000 mL | ORAL_SOLUTION | Freq: Four times a day (QID) | ORAL | 0 refills | Status: AC | PRN

## 2022-09-17 NOTE — ED Provider Notes (Signed)
[facility]    CSN: [id number] Arrival date & time: 09/17/22  1153      History   Chief Complaint Chief Complaint  Patient presents with   Cough   Fever   Sore Throat   Generalized Body Aches    HPI Tammy Todd is a 41 y.o. female presenting for 3 to 4-day history of fatigue, sore throat, body aches, cough and congestion.  Reports that she had a fever a couple days ago but not since.  She says that she started to get sick while she was at a work party.  She says symptoms worsened the following day.  She did take a COVID test and is negative.  She is not having any sinus pain, ear pain, chest pain or breathing difficulty.  She does have history of asthma and used her inhaler once over the past couple of days for 1 episode of shortness of breath that immediately resolved.  No other concerns.  HPI  Past Medical History:  Diagnosis Date   Abnormal weight gain 11/03/2018   Anemia    Anxiety    Class 3 severe obesity due to excess calories with serious comorbidity and body mass index (BMI) of 45.0 to 49.9 in adult ([place]) 11/03/2018   Depression    GERD (gastroesophageal reflux disease)    H. pylori infection    Hiatal hernia    Iron deficiency 04/03/2015   Scoliosis     Patient Active Problem List   Diagnosis Date Noted   Left lateral epicondylitis 07/31/2022   De Quervain's tenosynovitis, left 07/31/2022   History of gastric bypass 07/24/2021   Iron deficiency anemia due to chronic blood loss 04/25/2021   Excessive daytime sleepiness 09/07/2019   Gastroesophageal reflux disease 09/07/2019   Hiatal hernia 09/07/2019   Snoring 09/07/2019   Vitamin D deficiency 12/13/2018   Chronic midline low back pain without sciatica 11/03/2018   Encounter for weight loss counseling 11/03/2018   Generalized anxiety disorder 11/03/2018   Iron deficiency 04/03/2015   Dysmenorrhea 08/03/2014   Female fertility problem 08/03/2014    Past Surgical History:   Procedure Laterality Date   BARIATRIC SURGERY     COLONOSCOPY WITH PROPOFOL N/A 09/22/2019   Procedure: COLONOSCOPY WITH PROPOFOL;  Surgeon: Toledo, Benay Pike, MD;  Location: [facility];  Service: Endoscopy;  Laterality: N/A;   ESOPHAGOGASTRODUODENOSCOPY (EGD) WITH PROPOFOL N/A 09/22/2019   Procedure: ESOPHAGOGASTRODUODENOSCOPY (EGD) WITH PROPOFOL;  Surgeon: Toledo, Benay Pike, MD;  Location: [facility] END[facility];  Service: Endoscopy;  Laterality: N/A;   GASTROPLASTY DUODENAL SWITCH  10/24/2019   HIATAL HERNIA REPAIR  10/24/2019   LAPAROSCOPIC ASSISTED VAGINAL HYSTERECTOMY N/A 11/22/2021   Procedure: LAPAROSCOPIC ASSISTED VAGINAL HYSTERECTOMY AND BILATERAL SALPHINGECTOMY;  Surgeon: Harlin Heys, MD;  Location: [facility] ORS;  Service: Gynecology;  Laterality: N/A;    OB History     Gravida  2   Para      Term      Preterm      AB  2   Living         SAB  1   IAB  1   Ectopic      Multiple      Live Births               Home Medications    Prior to Admission medications   Medication Sig Start Date End Date Taking? Authorizing Provider  promethazine-dextromethorphan (PROMETHAZINE-DM) 6.25-15 MG/5ML syrup Take 5 mLs by mouth 4 (four)  times daily as needed. 09/17/22  Yes Danton Clap, PA-C  acetaminophen (TYLENOL) 500 MG tablet Take 1,000 mg by mouth daily.    [provider]  albuterol (VENTOLIN HFA) 108 (90 Base) MCG/ACT inhaler SMARTSIG:2 inhalation Via Inhaler Every 4 Hours PRN 07/09/22   [provider]  Calcium Carbonate (CALCIUM 500 PO) Take 500 mg by mouth 4 (four) times daily.    [provider]  calcium carbonate (TUMS - DOSED IN MG ELEMENTAL CALCIUM) 500 MG chewable tablet Chew 1 tablet by mouth as needed for indigestion or heartburn.    [provider]  celecoxib (CELEBREX) 200 MG capsule Take 1 capsule (200 mg total) by mouth 2 (two) times daily as needed. 08/12/22   Montel Culver, MD  cholecalciferol (VITAMIN D3) 25  MCG (1000 UNIT) tablet Take 1,000 Units by mouth daily.    [provider]  diazepam (VALIUM) 2 MG tablet SMARTSIG:1 Tablet(s) By Mouth Every 12 Hours PRN 05/30/22   [provider]  EPINEPHrine 0.3 mg/0.3 mL IJ SOAJ injection Inject into the muscle. 06/12/22   [provider]  gabapentin (NEURONTIN) 300 MG capsule Take 300 mg by mouth daily.    [provider]  Multiple Vitamin (MULTIVITAMIN WITH MINERALS) TABS tablet Take 1 tablet by mouth in the morning and at bedtime.    [provider]  oxyCODONE (OXY IR/ROXICODONE) 5 MG immediate release tablet Take 5 mg by mouth every 6 (six) hours as needed. 06/03/22   [provider]  sertraline (ZOLOFT) 25 MG tablet Take 25 mg by mouth daily. 07/29/22   [provider]  tiZANidine (ZANAFLEX) 2 MG tablet Take 2 mg by mouth 3 (three) times daily as needed. 07/24/22   [provider]  VITAMIN E PO Take by mouth.    [provider]    Family History Family History  Problem Relation Age of Onset   Diabetes Mother    Hyperlipidemia Mother    Hypertension Mother    Alcohol abuse Father    Hypertension Father    Obesity Father    Hyperlipidemia Father    Diabetes Father    Depression Sister    Anxiety disorder Sister    Diabetes Sister    Diabetes Brother    Hyperlipidemia Brother    Hypertension Brother    Obesity Paternal Grandmother    Breast cancer Paternal Aunt    Stomach cancer Paternal Uncle     Social History Social History   Tobacco Use   Smoking status: Former    Packs/day: 0.25    Years: 9.00    Total pack years: 2.25    Types: Cigarettes    Quit date: 12/2009    Years since quitting: 12.7   Smokeless tobacco: Never  Vaping Use   Vaping Use: Never used  Substance Use Topics   Alcohol use: Not Currently    Comment: social   Drug use: No     Allergies   Cashew nut oil, Penicillins, and Pistachio nut extract skin test   Review of  Systems Review of Systems  Constitutional:  Positive for fatigue. Negative for chills, diaphoresis and fever.  HENT:  Positive for congestion, rhinorrhea and sore throat. Negative for ear pain, sinus pressure and sinus pain.   Respiratory:  Positive for cough. Negative for shortness of breath.   Cardiovascular:  Negative for chest pain.  Gastrointestinal:  Negative for abdominal pain, nausea and vomiting.  Musculoskeletal:  Negative for arthralgias and myalgias.  Skin:  Negative for rash.  Neurological:  Positive for headaches. Negative for weakness.  Hematological:  Negative for adenopathy.     Physical Exam Triage Vital Signs ED Triage Vitals  Enc Vitals Group     BP 09/17/22 1251 (!) 145/89     Pulse Rate 09/17/22 1251 73     Resp --      Temp --      Temp src --      SpO2 09/17/22 1251 98 %     Weight 09/17/22 1250 130 lb (59 kg)     Height 09/17/22 1250 '5\' 3"'$  (1.6 m)     Head Circumference --      Peak Flow --      Pain Score 09/17/22 1250 0     Pain Loc --      Pain Edu? --      Excl. in [place]? --    No data found.  Updated Vital Signs BP (!) 145/89 (BP Location: Left Arm)   Pulse 73   Temp 98.7 F (37.1 C)   Ht '5\' 3"'$  (1.6 m)   Wt 130 lb (59 kg)   LMP 11/11/2021 (Exact Date)   SpO2 98%   BMI 23.03 kg/m    Physical Exam Vitals and nursing note reviewed.  Constitutional:      General: She is not in acute distress.    Appearance: Normal appearance. She is well-developed. She is not ill-appearing or toxic-appearing.  HENT:     Head: Normocephalic and atraumatic.     Nose: Congestion present.     Mouth/Throat:     Mouth: Mucous membranes are moist.     Pharynx: Oropharynx is clear. Posterior oropharyngeal erythema present.  Eyes:     General: No scleral icterus.       Right eye: No discharge.        Left eye: No discharge.     Conjunctiva/sclera: Conjunctivae normal.  Cardiovascular:     Rate and Rhythm: Normal rate and regular rhythm.     Heart sounds:  Normal heart sounds.  Pulmonary:     Effort: Pulmonary effort is normal. No respiratory distress.     Breath sounds: Normal breath sounds.  Musculoskeletal:     Cervical back: Neck supple.  Skin:    General: Skin is dry.  Neurological:     General: No focal deficit present.     Mental Status: She is alert. Mental status is at baseline.     Motor: No weakness.     Gait: Gait normal.  Psychiatric:        Mood and Affect: Mood normal.        Behavior: Behavior normal.        Thought Content: Thought content normal.      UC Treatments / Results  Labs (all labs ordered are listed, but only abnormal results are displayed) Labs Reviewed  GROUP A STREP BY PCR    EKG   Radiology No results found.  Procedures Procedures (including critical care time)  Medications Ordered in UC Medications - No data to display  Initial Impression / Assessment and Plan / UC Course  I have reviewed the triage vital signs and the nursing notes.  Pertinent labs & imaging results that were available during my care of the patient were reviewed by me and considered in my medical decision making (see chart for details).   40 year old female presents for 4-day history of cough, congestion, sore throat, fatigue and headaches.  Reports fever 2 days ago.  No breathing difficulty.  At home COVID test negative.  Vitals are stable.  She is afebrile.  She is overall well-appearing.  On exam she has mild posterior frontal erythema and mild nasal congestion.  Chest is clear to auscultation heart regular rate and rhythm.  PCR strep test performed and negative.  Discussed result with patient.  Advised patient symptoms consistent with viral URI.  Supportive care encouraged.  Sent Promethazine DM to pharmacy.  Reviewed that she should be feeling better over the next week.  Reviewed return precautions.  Work note given.   Final Clinical Impressions(s) / UC Diagnoses   Final diagnoses:  Viral upper respiratory  tract infection  Sore throat  Acute cough     Discharge Instructions      -Negative strep.  URI/COLD SYMPTOMS: Your exam today is consistent with a viral illness. Antibiotics are not indicated at this time. Use medications as directed, including cough syrup, nasal saline, and decongestants. Your symptoms should improve over the next few days and resolve within 7-10 days. Increase rest and fluids. F/u if symptoms worsen or predominate such as sore throat, ear pain, productive cough, shortness of breath, or if you develop high fevers or worsening fatigue over the next several days.       ED Prescriptions     Medication Sig Dispense Auth. Provider   promethazine-dextromethorphan (PROMETHAZINE-DM) 6.25-15 MG/5ML syrup Take 5 mLs by mouth 4 (four) times daily as needed. 118 mL Danton Clap, PA-C      PDMP not reviewed this encounter.   Danton Clap, PA-C 09/17/22 1352

## 2022-09-17 NOTE — Discharge Instructions (Signed)
-  Negative strep.  URI/COLD SYMPTOMS: Your exam today is consistent with a viral illness. Antibiotics are not indicated at this time. Use medications as directed, including cough syrup, nasal saline, and decongestants. Your symptoms should improve over the next few days and resolve within 7-10 days. Increase rest and fluids. F/u if symptoms worsen or predominate such as sore throat, ear pain, productive cough, shortness of breath, or if you develop high fevers or worsening fatigue over the next several days.

## 2022-09-17 NOTE — ED Triage Notes (Signed)
Patient reports that she has a cough, sore throat, body aches, fevers and fatigue -- started Saturday while at a work party.  Patient reports that her COVID test was negative.

## 2023-02-13 ENCOUNTER — Emergency Department
Admission: EM | Admit: 2023-02-13 | Discharge: 2023-02-13 | Disposition: A | Discharge status: Home / Self Care | Payer: BC Managed Care – PPO | Attending: Emergency Medicine | Admitting: Emergency Medicine

## 2023-02-13 ENCOUNTER — Emergency Department: Payer: BC Managed Care – PPO

## 2023-02-13 ENCOUNTER — Other Ambulatory Visit: Payer: Self-pay

## 2023-02-13 DIAGNOSIS — R110 Nausea: Secondary | ICD-10-CM | POA: Diagnosis not present

## 2023-02-13 DIAGNOSIS — R079 Chest pain, unspecified: Secondary | ICD-10-CM | POA: Insufficient documentation

## 2023-02-13 LAB — BASIC METABOLIC PANEL
Anion gap: 7 (ref 5–15)
BUN: 8 mg/dL (ref 6–20)
CO2: 22 mmol/L (ref 22–32)
Calcium: 8.3 mg/dL — ABNORMAL LOW (ref 8.9–10.3)
Chloride: 112 mmol/L — ABNORMAL HIGH (ref 98–111)
Creatinine, Ser: 0.55 mg/dL (ref 0.44–1.00)
GFR, Estimated: >60 mL/min (ref >60)
Glucose, Bld: 93 mg/dL (ref 70–99)
Potassium: 3.9 mmol/L (ref 3.5–5.1)
Sodium: 141 mmol/L (ref 135–145)

## 2023-02-13 LAB — CBC
HCT: 39.1 % (ref 36.0–46.0)
Hemoglobin: 12.3 g/dL (ref 12.0–15.0)
MCH: 29.4 pg (ref 26.0–34.0)
MCHC: 31.5 g/dL (ref 30.0–36.0)
MCV: 93.3 fL (ref 80.0–100.0)
Platelets: 255 10*3/uL (ref 150–400)
RBC: 4.19 MIL/uL (ref 3.87–5.11)
RDW: 13.1 % (ref 11.5–15.5)
WBC: 3.5 10*3/uL — ABNORMAL LOW (ref 4.0–10.5)
nRBC: 0 % (ref 0.0–0.2)

## 2023-02-13 LAB — TROPONIN I (HIGH SENSITIVITY): Troponin I (High Sensitivity): <2 ng/L (ref <18)

## 2023-02-13 MED ORDER — OMEPRAZOLE MAGNESIUM 20 MG PO TBEC: 20.0000 mg | DELAYED_RELEASE_TABLET | Freq: Every day | ORAL | 1 refills | Status: AC

## 2023-02-13 NOTE — ED Provider Notes (Signed)
[facility] Provider Note   Event Date/Time   First MD Initiated Contact with Patient 02/13/23 0930     (approximate) History  Chest Pain  HPI Tammy Todd is a 42 y.o. female with no stated past medical history presents after left-sided chest pain that began during eating today.  Patient states that she began with a sharp, left-sided, nonradiating chest pain that turned into a dull aching/burning pain after approximately 1-2 hours.  Patient states that the pain is still present however not as significant as it was earlier today.  Patient denies any exertional worsening or pleuritic aspect to this pain.  Patient does endorse nausea without vomiting ROS: Patient currently denies any vision changes, tinnitus, difficulty speaking, facial droop, sore throat, shortness of breath, abdominal pain, vomiting/diarrhea, dysuria, or weakness/numbness/paresthesias in any extremity   Physical Exam  Triage Vital Signs: ED Triage Vitals  Enc Vitals Group     BP 02/13/23 0923 115/77     Pulse Rate 02/13/23 0923 (!) 58     Resp 02/13/23 0923 18     Temp 02/13/23 0923 97.9 F (36.6 C)     Temp Source 02/13/23 0923 Oral     SpO2 02/13/23 0923 98 %     Weight --      Height --      Head Circumference --      Peak Flow --      Pain Score 02/13/23 0925 6     Pain Loc --      Pain Edu? --      Excl. in [place]? --    Most recent vital signs: Vitals:   02/13/23 1045 02/13/23 1100  BP:  109/64  Pulse: 69   Resp:    Temp:    SpO2:  97%   General: Awake, oriented x4. CV:  Good peripheral perfusion.  Resp:  Normal effort.  Abd:  No distention.  Other:  Middle-aged overweight African-American female laying in bed in no acute distress ED Results / Procedures / Treatments  Labs (all labs ordered are listed, but only abnormal results are displayed) Labs Reviewed  BASIC METABOLIC PANEL - Abnormal; Notable for the following components:      Result Value   Chloride 112  (*)    Calcium 8.3 (*)    All other components within normal limits  CBC - Abnormal; Notable for the following components:   WBC 3.5 (*)    All other components within normal limits  TROPONIN I (HIGH SENSITIVITY)  TROPONIN I (HIGH SENSITIVITY)   EKG ED ECG REPORT I, Naaman Plummer, the attending physician, personally viewed and interpreted this ECG. Date: 02/13/2023 EKG Time: 0923 Rate: 60 Rhythm: normal sinus rhythm QRS Axis: normal Intervals: normal ST/T Wave abnormalities: normal Narrative Interpretation: no evidence of acute ischemia RADIOLOGY ED MD interpretation: 2 view chest x-ray interpreted by me shows no evidence of acute abnormalities including no pneumonia, pneumothorax, or widened mediastinum -Agree with radiology assessment Official radiology report(s): DG Chest 2 View  Result Date: 02/13/2023 CLINICAL DATA:  Chest pain EXAM: CHEST - 2 VIEW COMPARISON:  Chest x-ray 06/03/2018 FINDINGS: No consolidation, pneumothorax or effusion. No edema. Normal cardiopericardial silhouette. Hyperinflation. Overlapping cardiac leads. Degenerative changes seen along the spine. IMPRESSION: Hyperinflation.  No acute cardiopulmonary disease Electronically Signed   By: Jill Side M.D.   On: 02/13/2023 09:53   PROCEDURES: Critical Care performed: No .1-3 Lead EKG Interpretation  Performed by: Naaman Plummer, MD Authorized  by: Naaman Plummer, MD     Interpretation: normal     ECG rate:  71   ECG rate assessment: normal     Rhythm: sinus rhythm     Ectopy: none     Conduction: normal    MEDICATIONS ORDERED IN ED: Medications - No data to display IMPRESSION / MDM / [place] / ED COURSE  I reviewed the triage vital signs and the nursing notes.                             The patient is on the cardiac monitor to evaluate for evidence of arrhythmia and/or significant heart rate changes. Patient's presentation is most consistent with acute presentation with potential  threat to life or bodily function. Workup: ECG, CXR, CBC, BMP, Troponin Findings: ECG: No overt evidence of STEMI. No evidence of Brugadas sign, delta wave, epsilon wave, significantly prolonged QTc, or malignant arrhythmia HS Troponin: Negative x1 Other Labs unremarkable for emergent problems. CXR: Without PTX, PNA, or widened mediastinum Last Stress Test: Never Last Heart Catheterization: Never HEART Score: 3  Given History, Exam, and Workup I have low suspicion for ACS, Pneumothorax, Pneumonia, Pulmonary Embolus, Tamponade, Aortic Dissection or other emergent problem as a cause for this presentation.   Reassesment: Prior to discharge patients pain was controlled and they were well appearing.  Disposition:  Discharge. Strict return precautions discussed with patient with full understanding. Advised patient to follow up promptly with primary care provider    FINAL CLINICAL IMPRESSION(S) / ED DIAGNOSES   Final diagnoses:  Chest pain, unspecified type   Rx / DC Orders   ED Discharge Orders          Ordered    Ambulatory referral to Cardiology       Comments: If you have not heard from the Cardiology office within the next 72 hours please call [phone].   02/13/23 1055    omeprazole (PRILOSEC OTC) 20 MG tablet  Daily        02/13/23 1055           Note:  This document was prepared using Dragon voice recognition software and may include unintentional dictation errors.   Naaman Plummer, MD 02/13/23 [phone]

## 2023-02-13 NOTE — ED Triage Notes (Signed)
Pt to ED via POV from home. Pt reports weakness, dizziness and CP that started around 7am.

## 2023-10-02 ENCOUNTER — Telehealth: Payer: BC Managed Care – PPO | Admitting: Physician Assistant

## 2023-10-02 DIAGNOSIS — A084 Viral intestinal infection, unspecified: Secondary | ICD-10-CM

## 2023-10-02 MED ORDER — LOPERAMIDE HCL 2 MG PO TABS: 2.0000 mg | ORAL_TABLET | Freq: Four times a day (QID) | ORAL | 0 refills | Status: AC | PRN

## 2023-10-02 MED ORDER — ONDANSETRON 4 MG PO TBDP: 4.0000 mg | ORAL_TABLET | Freq: Three times a day (TID) | ORAL | 0 refills | Status: AC | PRN

## 2023-10-02 MED ORDER — DICYCLOMINE HCL 10 MG PO CAPS: 10.0000 mg | ORAL_CAPSULE | Freq: Three times a day (TID) | ORAL | 0 refills | Status: AC

## 2023-10-02 NOTE — Progress Notes (Signed)
Virtual Visit Consent   Tammy Todd, you are scheduled for a virtual visit with a [facility] provider today. Just as with appointments in the office, your consent must be obtained to participate. Your consent will be active for this visit and any virtual visit you may have with one of our providers in the next 365 days. If you have a MyChart account, a copy of this consent can be sent to you electronically.  As this is a virtual visit, video technology does not allow for your provider to perform a traditional examination. This may limit your provider's ability to fully assess your condition. If your provider identifies any concerns that need to be evaluated in person or the need to arrange testing (such as labs, EKG, etc.), we will make arrangements to do so. Although advances in technology are sophisticated, we cannot ensure that it will always work on either your end or our end. If the connection with a video visit is poor, the visit may have to be switched to a telephone visit. With either a video or telephone visit, we are not always able to ensure that we have a secure connection.  By engaging in this virtual visit, you consent to the provision of healthcare and authorize for your insurance to be billed (if applicable) for the services provided during this visit. Depending on your insurance coverage, you may receive a charge related to this service.  I need to obtain your verbal consent now. Are you willing to proceed with your visit today? Tammy Todd has provided verbal consent on 10/02/2023 for a virtual visit (video or telephone). [name], PA-C  Date: 10/02/2023 8:53 AM  Virtual Visit via Video Note   I, [name], connected with  Tammy Todd  ([id number], 06/27/1981) on 10/02/23 at  8:45 AM EST by a video-enabled telemedicine application and verified that I am speaking with the correct person using two  identifiers.  Location: Patient: Virtual Visit Location Patient: Home Provider: Virtual Visit Location Provider: Home Office   I discussed the limitations of evaluation and management by telemedicine and the availability of in person appointments. The patient expressed understanding and agreed to proceed.    History of Present Illness: Tammy Todd is a 42 y.o. who identifies as a female who was assigned female at birth, and is being seen today for diarrhea and abdominal cramping.  HPI: Diarrhea  This is a new problem. The current episode started yesterday. The problem occurs 5 to 10 times per day. The problem has been unchanged. The stool consistency is described as Watery and mucous. The patient states that diarrhea awakens her from sleep. Associated symptoms include abdominal pain, bloating, increased flatus, myalgias (does have fibromyalgia) and sweats. Pertinent negatives include no chills, fever, headaches or vomiting. Nothing aggravates the symptoms. She has tried nothing for the symptoms. The treatment provided no relief.     Problems:  Patient Active Problem List   Diagnosis Date Noted   Left lateral epicondylitis 07/31/2022   De Quervain's tenosynovitis, left 07/31/2022   History of gastric bypass 07/24/2021   Iron deficiency anemia due to chronic blood loss 04/25/2021   Excessive daytime sleepiness 09/07/2019   Gastroesophageal reflux disease 09/07/2019   Hiatal hernia 09/07/2019   Snoring 09/07/2019   Vitamin D deficiency 12/13/2018   Chronic midline low back pain without sciatica 11/03/2018   Encounter for weight loss counseling 11/03/2018   Generalized anxiety disorder 11/03/2018  Iron deficiency 04/03/2015   Dysmenorrhea 08/03/2014   Female fertility problem 08/03/2014    Allergies:  Allergies  Allergen Reactions   Cashew Nut Oil Swelling    Lips tingle and swell    Pistachio Nut Extract Nausea And Vomiting and Swelling    Lips tingle and swell     Medications:  Current Outpatient Medications:    dicyclomine (BENTYL) 10 MG capsule, Take 1 capsule (10 mg total) by mouth 4 (four) times daily -  before meals and at bedtime., Disp: 40 capsule, Rfl: 0   loperamide (IMODIUM A-D) 2 MG tablet, Take 1 tablet (2 mg total) by mouth 4 (four) times daily as needed for diarrhea or loose stools., Disp: 30 tablet, Rfl: 0   ondansetron (ZOFRAN-ODT) 4 MG disintegrating tablet, Take 1 tablet (4 mg total) by mouth every 8 (eight) hours as needed., Disp: 20 tablet, Rfl: 0   acetaminophen (TYLENOL) 500 MG tablet, Take 1,000 mg by mouth daily., Disp: , Rfl:    albuterol (VENTOLIN HFA) 108 (90 Base) MCG/ACT inhaler, SMARTSIG:2 inhalation Via Inhaler Every 4 Hours PRN, Disp: , Rfl:    Calcium Carbonate (CALCIUM 500 PO), Take 500 mg by mouth 4 (four) times daily., Disp: , Rfl:    calcium carbonate (TUMS - DOSED IN MG ELEMENTAL CALCIUM) 500 MG chewable tablet, Chew 1 tablet by mouth as needed for indigestion or heartburn., Disp: , Rfl:    celecoxib (CELEBREX) 200 MG capsule, Take 1 capsule (200 mg total) by mouth 2 (two) times daily as needed., Disp: 60 capsule, Rfl: 0   cholecalciferol (VITAMIN D3) 25 MCG (1000 UNIT) tablet, Take 1,000 Units by mouth daily., Disp: , Rfl:    diazepam (VALIUM) 2 MG tablet, SMARTSIG:1 Tablet(s) By Mouth Every 12 Hours PRN, Disp: , Rfl:    EPINEPHrine 0.3 mg/0.3 mL IJ SOAJ injection, Inject into the muscle., Disp: , Rfl:    gabapentin (NEURONTIN) 300 MG capsule, Take 300 mg by mouth daily., Disp: , Rfl:    Multiple Vitamin (MULTIVITAMIN WITH MINERALS) TABS tablet, Take 1 tablet by mouth in the morning and at bedtime., Disp: , Rfl:    omeprazole (PRILOSEC OTC) 20 MG tablet, Take 1 tablet (20 mg total) by mouth daily., Disp: 28 tablet, Rfl: 1   oxyCODONE (OXY IR/ROXICODONE) 5 MG immediate release tablet, Take 5 mg by mouth every 6 (six) hours as needed., Disp: , Rfl:    promethazine-dextromethorphan (PROMETHAZINE-DM) 6.25-15 MG/5ML  syrup, Take 5 mLs by mouth 4 (four) times daily as needed., Disp: 118 mL, Rfl: 0   sertraline (ZOLOFT) 25 MG tablet, Take 25 mg by mouth daily., Disp: , Rfl:    tiZANidine (ZANAFLEX) 2 MG tablet, Take 2 mg by mouth 3 (three) times daily as needed., Disp: , Rfl:    VITAMIN E PO, Take by mouth., Disp: , Rfl:   Observations/Objective: Patient is well-developed, well-nourished in no acute distress.  Resting comfortably at home.  Head is normocephalic, atraumatic.  No labored breathing.  Speech is clear and coherent with logical content.  Patient is alert and oriented at baseline.    Assessment and Plan: 1. Viral gastroenteritis - dicyclomine (BENTYL) 10 MG capsule; Take 1 capsule (10 mg total) by mouth 4 (four) times daily -  before meals and at bedtime.  Dispense: 40 capsule; Refill: 0 - loperamide (IMODIUM A-D) 2 MG tablet; Take 1 tablet (2 mg total) by mouth 4 (four) times daily as needed for diarrhea or loose stools.  Dispense: 30 tablet; Refill: 0 -  ondansetron (ZOFRAN-ODT) 4 MG disintegrating tablet; Take 1 tablet (4 mg total) by mouth every 8 (eight) hours as needed.  Dispense: 20 tablet; Refill: 0  - Suspect viral gastroenteritis - Zofran for nausea - Bentyl for cramping - Imodium for diarrhea - Push fluids, electrolyte beverages - Liquid diet, then increase to soft/bland (BRAT) diet over next day, then increase diet as tolerated - Seek in person evaluation if not improving or symptoms worsen   Follow Up Instructions: I discussed the assessment and treatment plan with the patient. The patient was provided an opportunity to ask questions and all were answered. The patient agreed with the plan and demonstrated an understanding of the instructions.  A copy of instructions were sent to the patient via MyChart unless otherwise noted below.    The patient was advised to call back or seek an in-person evaluation if the symptoms worsen or if the condition fails to improve as  anticipated.    [name], PA-C

## 2023-10-02 NOTE — Patient Instructions (Signed)
Tammy Todd, thank you for joining [name], PA-C for today's virtual visit.  While this provider is not your primary care provider (PCP), if your PCP is located in our provider database this encounter information will be shared with them immediately following your visit.   A [facility] MyChart account gives you access to today's visit and all your visits, tests, and labs performed at [facility] " click here if you don't have a [facility] MyChart account or go to [email or web address]  Consent: (Patient) Tammy Todd provided verbal consent for this virtual visit at the beginning of the encounter.  Current Medications:  Current Outpatient Medications:    dicyclomine (BENTYL) 10 MG capsule, Take 1 capsule (10 mg total) by mouth 4 (four) times daily -  before meals and at bedtime., Disp: 40 capsule, Rfl: 0   loperamide (IMODIUM A-D) 2 MG tablet, Take 1 tablet (2 mg total) by mouth 4 (four) times daily as needed for diarrhea or loose stools., Disp: 30 tablet, Rfl: 0   ondansetron (ZOFRAN-ODT) 4 MG disintegrating tablet, Take 1 tablet (4 mg total) by mouth every 8 (eight) hours as needed., Disp: 20 tablet, Rfl: 0   acetaminophen (TYLENOL) 500 MG tablet, Take 1,000 mg by mouth daily., Disp: , Rfl:    albuterol (VENTOLIN HFA) 108 (90 Base) MCG/ACT inhaler, SMARTSIG:2 inhalation Via Inhaler Every 4 Hours PRN, Disp: , Rfl:    Calcium Carbonate (CALCIUM 500 PO), Take 500 mg by mouth 4 (four) times daily., Disp: , Rfl:    calcium carbonate (TUMS - DOSED IN MG ELEMENTAL CALCIUM) 500 MG chewable tablet, Chew 1 tablet by mouth as needed for indigestion or heartburn., Disp: , Rfl:    celecoxib (CELEBREX) 200 MG capsule, Take 1 capsule (200 mg total) by mouth 2 (two) times daily as needed., Disp: 60 capsule, Rfl: 0   cholecalciferol (VITAMIN D3) 25 MCG (1000 UNIT) tablet, Take 1,000 Units by mouth daily., Disp: , Rfl:    diazepam (VALIUM) 2 MG tablet,  SMARTSIG:1 Tablet(s) By Mouth Every 12 Hours PRN, Disp: , Rfl:    EPINEPHrine 0.3 mg/0.3 mL IJ SOAJ injection, Inject into the muscle., Disp: , Rfl:    gabapentin (NEURONTIN) 300 MG capsule, Take 300 mg by mouth daily., Disp: , Rfl:    Multiple Vitamin (MULTIVITAMIN WITH MINERALS) TABS tablet, Take 1 tablet by mouth in the morning and at bedtime., Disp: , Rfl:    omeprazole (PRILOSEC OTC) 20 MG tablet, Take 1 tablet (20 mg total) by mouth daily., Disp: 28 tablet, Rfl: 1   oxyCODONE (OXY IR/ROXICODONE) 5 MG immediate release tablet, Take 5 mg by mouth every 6 (six) hours as needed., Disp: , Rfl:    promethazine-dextromethorphan (PROMETHAZINE-DM) 6.25-15 MG/5ML syrup, Take 5 mLs by mouth 4 (four) times daily as needed., Disp: 118 mL, Rfl: 0   sertraline (ZOLOFT) 25 MG tablet, Take 25 mg by mouth daily., Disp: , Rfl:    tiZANidine (ZANAFLEX) 2 MG tablet, Take 2 mg by mouth 3 (three) times daily as needed., Disp: , Rfl:    VITAMIN E PO, Take by mouth., Disp: , Rfl:    Medications ordered in this encounter:  Meds ordered this encounter  Medications   dicyclomine (BENTYL) 10 MG capsule    Sig: Take 1 capsule (10 mg total) by mouth 4 (four) times daily -  before meals and at bedtime.    Dispense:  40 capsule    Refill:  0  Order Specific Question:   Supervising Provider    Answer:   Merrilee Jansky [[id number]]   loperamide (IMODIUM A-D) 2 MG tablet    Sig: Take 1 tablet (2 mg total) by mouth 4 (four) times daily as needed for diarrhea or loose stools.    Dispense:  30 tablet    Refill:  0    Order Specific Question:   Supervising Provider    Answer:   LAMPTEY, PHILIP O [[id number]]   ondansetron (ZOFRAN-ODT) 4 MG disintegrating tablet    Sig: Take 1 tablet (4 mg total) by mouth every 8 (eight) hours as needed.    Dispense:  20 tablet    Refill:  0    Order Specific Question:   Supervising Provider    Answer:   Merrilee Jansky [id number]     *If you need refills on other medications prior  to your next appointment, please contact your pharmacy*  Follow-Up: Call back or seek an in-person evaluation if the symptoms worsen or if the condition fails to improve as anticipated.  [facility] [phone]  Other Instructions Viral Gastroenteritis, Adult  Viral gastroenteritis is also known as the stomach flu. This condition may affect your stomach, small intestine, and large intestine. It can cause sudden watery diarrhea, fever, and vomiting. This condition is caused by many different viruses. These viruses can be passed from person to person very easily (are contagious). Diarrhea and vomiting can make you feel weak and cause you to become dehydrated. You may not be able to keep fluids down. Dehydration can make you tired and thirsty, cause you to have a dry mouth, and decrease how often you urinate. It is important to replace the fluids that you lose from diarrhea and vomiting. What are the causes? Gastroenteritis is caused by many viruses, including rotavirus and norovirus. Norovirus is the most common cause in adults. You can get sick after being exposed to the viruses from other people. You can also get sick by: Eating food, drinking water, or touching a surface contaminated with one of these viruses. Sharing utensils or other personal items with an infected person. What increases the risk? You are more likely to develop this condition if you: Have a weak body defense system (immune system). Live with one or more children who are younger than 2 years. Live in a nursing home. Travel on cruise ships. What are the signs or symptoms? Symptoms of this condition start suddenly 1-3 days after exposure to a virus. Symptoms may last for a few days or for as long as a week. Common symptoms include watery diarrhea and vomiting. Other symptoms include: Fever. Headache. Fatigue. Pain in the abdomen. Chills. Weakness. Nausea. Muscle aches. Loss of appetite. How is this  diagnosed? This condition is diagnosed with a medical history and physical exam. You may also have a stool test to check for viruses or other infections. How is this treated? This condition typically goes away on its own. The focus of treatment is to prevent dehydration and restore lost fluids (rehydration). This condition may be treated with: An oral rehydration solution (ORS) to replace important salts and minerals (electrolytes) in your body. Take this if told by your health care provider. This is a drink that is sold at pharmacies and retail stores. Medicines to help with your symptoms. Probiotic supplements to reduce symptoms of diarrhea. Fluids given through an IV, if dehydration is severe. Older adults and people with other diseases or a weak immune  system are at higher risk for dehydration. Follow these instructions at home: Eating and drinking  Take an ORS as told by your health care provider. Drink clear fluids in small amounts as you are able. Clear fluids include: Water. Ice chips. Diluted fruit juice. Low-calorie sports drinks. Drink enough fluid to keep your urine pale yellow. Eat small amounts of healthy foods every 3-4 hours as you are able. This may include whole grains, fruits, vegetables, lean meats, and yogurt. Avoid fluids that contain a lot of sugar or caffeine, such as energy drinks, sports drinks, and soda. Avoid spicy or fatty foods. Avoid alcohol. General instructions  Wash your hands often, especially after having diarrhea or vomiting. If soap and water are not available, use hand sanitizer. Make sure that all people in your household wash their hands well and often. Take over-the-counter and prescription medicines only as told by your health care provider. Rest at home while you recover. Watch your condition for any changes. Take a warm bath to relieve any burning or pain from frequent diarrhea episodes. Keep all follow-up visits. This is important. Contact  a health care provider if you: Cannot keep fluids down. Have symptoms that get worse. Have new symptoms. Feel light-headed or dizzy. Have muscle cramps. Get help right away if you: Have chest pain. Have trouble breathing or you are breathing very quickly. Have a fast heartbeat. Feel extremely weak or you faint. Have a severe headache, a stiff neck, or both. Have a rash. Have severe pain, cramping, or bloating in your abdomen. Have skin that feels cold and clammy. Feel confused. Have pain when you urinate. Have signs of dehydration, such as: Dark urine, very little urine, or no urine. Cracked lips. Dry mouth. Sunken eyes. Sleepiness. Weakness. Have signs of bleeding, such as: Seeing blood in your vomit. Having vomit that looks like coffee grounds. Having bloody or black stools or stools that look like tar. These symptoms may be an emergency. Get help right away. Call 911. Do not wait to see if the symptoms will go away. Do not drive yourself to the hospital. Summary Viral gastroenteritis is also known as the stomach flu. It can cause sudden watery diarrhea, fever, and vomiting. This condition can be passed from person to person very easily (is contagious). Take an oral rehydration solution (ORS) if told by your health care provider. This is a drink that is sold at pharmacies and retail stores. Wash your hands often, especially after having diarrhea or vomiting. If soap and water are not available, use hand sanitizer. This information is not intended to replace advice given to you by your health care provider. Make sure you discuss any questions you have with your health care provider. Document Revised: 09/02/2021 Document Reviewed: 09/02/2021 Elsevier Patient Education  2024 Elsevier Inc.    If you have been instructed to have an in-person evaluation today at a local Urgent Care facility, please use the link below. It will take you to a list of all of our available [facility], including address, phone number and hours of operation. Please do not delay care.  [facility]  If you or a family member do not have a primary care provider, use the link below to schedule a visit and establish care. When you choose a [facility] primary care physician or advanced practice provider, you gain a long-term partner in health. Find a Primary Care Provider  Learn more about [facility] in-office and virtual care options:  [facility] Now

## 2023-11-11 ENCOUNTER — Other Ambulatory Visit: Payer: Self-pay | Admitting: Medical Genetics

## 2023-11-19 ENCOUNTER — Other Ambulatory Visit
Admission: RE | Admit: 2023-11-19 | Discharge: 2023-11-19 | Disposition: A | Discharge status: Home / Self Care | Payer: Self-pay | Source: Ambulatory Visit | Attending: Medical Genetics | Admitting: Medical Genetics

## 2023-12-07 LAB — GENECONNECT MOLECULAR SCREEN: Genetic Analysis Overall Interpretation: NEGATIVE

## 2024-04-28 ENCOUNTER — Ambulatory Visit: Admitting: Family Medicine

## 2024-04-28 ENCOUNTER — Encounter: Payer: Self-pay | Admitting: Family Medicine

## 2024-04-28 ENCOUNTER — Ambulatory Visit: Admitting: Sports Medicine

## 2024-04-28 VITALS — BP 106/72 | Ht 63.0 in | Wt 142.0 lb

## 2024-04-28 DIAGNOSIS — M654 Radial styloid tenosynovitis [de Quervain]: Secondary | ICD-10-CM | POA: Diagnosis not present

## 2024-04-28 MED ORDER — METHYLPREDNISOLONE ACETATE 40 MG/ML IJ SUSP
40.0000 mg | Freq: Once | INTRAMUSCULAR | Status: AC
  Administered 2024-04-28: 40 mg via INTRA_ARTICULAR

## 2024-04-28 NOTE — Progress Notes (Signed)
    SUBJECTIVE:   CHIEF COMPLAINT:  b/l wrist/thumb pain  HPI:  Tammy Todd is a 43yo F w/ hx of de quervains of L thumb, fibromyalgia that presents for BL thumb pain. - Per chart review, pt was last seen by Dr. Augustus Ledger 07/2022 for L Bedelia Bowen, failed supportive management w/ thumb spice and NSAIDs, and then got a steroid.  - Pt reports that the injection was very helpful for her pain - Then over the past few months, she started to get L thumb pain again in same area - Then she also started to get the same pain but on the R thumb. Currently, the R thumb hurts more - She works as a Museum/gallery conservator, and picks up animals a lot. She has been out of work since January 2025 due to mental health condition, but still takes care of her dogs at home. - She has tried wearing her thumb spica for past month, but does not feel this is helpful. - No hx of thyroid or diabetes issues.     OBJECTIVE:   BP 106/72   Ht 5' 3 (1.6 m)   Wt 142 lb (64.4 kg)   LMP 11/11/2021 (Exact Date)   BMI 25.15 kg/m   General: Alert, pleasant woman. NAD. HEENT: NCAT. MMM.  Resp:  Normal WOB on RA.   MSK: Positive finklestein on BL thumbs. Tenderness to palpation along radial wrist BL. Symmetric grip strength of BL hands.   ASSESSMENT/PLAN:   Assessment & Plan De Quervain's tenosynovitis, left Hx and exam c/w BL de quervains tensosynovitis. Has tried thumb spica, with minimal improvement. Will proceed with steroid injection of BL thumbs.  - Continue thumb spica, especially at night - f/u in 4-6 wks if persistent De Quervain's tenosynovitis, right Steroid injection as above.    Albin Huh, MD [facility]

## 2024-04-28 NOTE — Patient Instructions (Addendum)

## 2024-04-28 NOTE — Assessment & Plan Note (Signed)
 Hx and exam c/w BL de quervains tensosynovitis. Has tried thumb spica, with minimal improvement. Will proceed with steroid injection of BL thumbs.  - Continue thumb spica, especially at night - f/u in 4-6 wks if persistent

## 2024-06-02 ENCOUNTER — Ambulatory Visit: Admitting: Family Medicine

## 2024-06-14 ENCOUNTER — Other Ambulatory Visit: Payer: Self-pay

## 2024-06-14 ENCOUNTER — Ambulatory Visit (INDEPENDENT_AMBULATORY_CARE_PROVIDER_SITE_OTHER): Admitting: Family Medicine

## 2024-06-14 ENCOUNTER — Encounter: Payer: Self-pay | Admitting: Family Medicine

## 2024-06-14 VITALS — BP 92/68 | Ht 63.0 in | Wt 156.0 lb

## 2024-06-14 DIAGNOSIS — M654 Radial styloid tenosynovitis [de Quervain]: Secondary | ICD-10-CM

## 2024-06-14 NOTE — Assessment & Plan Note (Signed)
 Left de Quervain's, somewhat improved following cortisone injection at last visit 04/28/2024  Plan: - MSK ultrasound completed as noted above.  No significant abnormalities appreciated on the left.  Does appear to be improving - Would benefit from hand therapy.  Referral to [facility] OT for hand therapy given - Recommended she start using her thumb spica braces at bedtime.  Should use these over the next 2 to 4 weeks - Can continue Voltaren gel as needed - Follow-up 4 to 6 weeks for reevaluation, sooner as needed

## 2024-06-14 NOTE — Progress Notes (Signed)
 DATE OF VISIT: 06/14/2024        Tammy Todd Wonda Obadiah DOB: June 27, 1981 MRN: [id number]  CC: Follow-up bilateral de Quervain's  History of present Illness: Tammy Todd is a 43 y.o. 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  Medications:  Outpatient Encounter Medications as of 06/14/2024  Medication Sig   acetaminophen  (TYLENOL ) 500 MG tablet Take 1,000 mg by mouth daily.   albuterol (VENTOLIN HFA) 108 (90 Base) MCG/ACT inhaler SMARTSIG:2 inhalation Via Inhaler Every 4 Hours PRN   Calcium Carbonate (CALCIUM 500 PO) Take 500 mg by mouth 4 (four) times daily.   calcium carbonate (TUMS - DOSED IN MG ELEMENTAL CALCIUM) 500 MG chewable tablet Chew 1 tablet by mouth as needed for indigestion or heartburn.   celecoxib  (CELEBREX ) 200 MG capsule Take 1 capsule (200 mg total) by mouth 2 (two) times daily as needed.   cholecalciferol (VITAMIN D3) 25 MCG (1000 UNIT) tablet Take 1,000 Units by mouth daily.   diazepam (VALIUM) 2 MG tablet SMARTSIG:1 Tablet(s) By Mouth Every 12 Hours PRN   dicyclomine  (BENTYL ) 10 MG capsule Take 1 capsule (10 mg total) by mouth 4 (four) times daily -  before meals and at bedtime.   EPINEPHrine 0.3 mg/0.3 mL IJ SOAJ injection Inject into the muscle.   gabapentin (NEURONTIN) 300 MG capsule Take 300 mg by mouth daily.   loperamide  (IMODIUM  A-D) 2 MG tablet Take 1 tablet (2 mg total) by mouth 4 (four) times daily as needed for diarrhea or loose stools.   Multiple Vitamin (MULTIVITAMIN WITH  MINERALS) TABS tablet Take 1 tablet by mouth in the morning and at bedtime.   omeprazole  (PRILOSEC  OTC) 20 MG tablet Take 1 tablet (20 mg total) by mouth daily.   ondansetron  (ZOFRAN -ODT) 4 MG disintegrating tablet Take 1 tablet (4 mg total) by mouth every 8 (eight) hours as needed.   oxyCODONE  (OXY IR/ROXICODONE ) 5 MG immediate release tablet Take 5 mg by mouth every 6 (six) hours as needed.   promethazine -dextromethorphan (PROMETHAZINE -DM) 6.25-15 MG/5ML syrup Take 5 mLs by mouth 4 (four) times daily as needed.   sertraline (ZOLOFT) 25 MG tablet Take 25 mg by mouth daily.   tiZANidine (ZANAFLEX) 2 MG tablet Take 2 mg by mouth 3 (three) times daily as needed.   VITAMIN E PO Take by mouth.   No facility-administered encounter medications on file as of 06/14/2024.    Allergies: is allergic to cashew nut oil and pistachio nut extract.  Physical Examination: Vitals: BP 92/68   Ht 5' 3 (1.6 m)   Wt 156 lb (70.8 kg)   LMP 11/11/2021 (Exact Date)   BMI 27.63 kg/m  GENERAL:  Saprina Todd is a 43 y.o. female appearing their stated age, alert and oriented x 3, in no apparent distress.  SKIN: no rashes or lesions, skin clean, dry, intact MSK: Bilateral hand/wrist without any swelling or effusion.  Full range of motion bilaterally with pain with ulnar deviation.  Mildly tender palpation along first extensor compartment bilaterally, right greater than left.  Positive Finkelstein's on the right, mildly positive Finkelstein's on the left.  Normal grip strength. Neuro vastly intact distally  Radiology: Limited MSK ultrasound bilateral wrist Date: 06/14/2024 Indication: Bilateral wrist pain Findings: - Right wrist with small amount of fluid around the first extensor compartment.  No increased Doppler flow.  No tendon abnormalities appreciated.  Well-visualized in short long axis views.  Normal appearing intersection between 1st and 2nd extensor compartments.  Slight amount of fluid around  second extensor compartment - Left wrist without any significant fluid around the first extensor compartment.  No increased Doppler flow.  No tendon abnormalities appreciated.  Well-visualized in short and long axis views.  Normal appearing intersection between 1st and 2nd extensor compartments.  No significant fluid around second extensor compartment  Impression: - Bilateral de Quervain's tendinitis, appears mild Images and interpretation completed by Rainell Cedar, DO    Assessment & Plan De Quervain's tenosynovitis, left Left de Quervain's, somewhat improved following cortisone injection at last visit 04/28/2024  Plan: - MSK ultrasound completed as noted above.  No significant abnormalities appreciated on the left.  Does appear to be improving - Would benefit from hand therapy.  Referral to [facility] OT for hand therapy given - Recommended she start using her thumb spica braces at bedtime.  Should use these over the next 2 to 4 weeks - Can continue Voltaren gel as needed - Follow-up 4 to 6 weeks for reevaluation, sooner as needed De Quervain's tenosynovitis, right Right de Quervain's tendinitis, no significant improvement with cortisone injection at last visit 04/28/2024  Plan: - MSK ultrasound completed today.  Had small amount of fluid in the 1st and 2nd extensor compartments.  No increased hyperemia.  Overall physical exam does appear to be improved. - Would benefit from hand therapy.  Referral to [facility] OT for hand therapy given - Recommended she start using her thumb spica braces at bedtime.  Should use these over the next 2 to 4 weeks - Can continue Voltaren gel as needed - Follow-up 4 to 6 weeks for reevaluation, sooner as needed.  If no improvement would consider possible referral to hand surgeon for further evaluation   Patient expressed understanding & agreement with above.  Encounter Diagnoses  Name Primary?   De Quervain's tenosynovitis, left Yes   De Quervain's  tenosynovitis, right     Orders Placed This Encounter  Procedures   US  LIMITED JOINT SPACE STRUCTURES UP BILAT

## 2024-07-19 ENCOUNTER — Ambulatory Visit: Admitting: Family Medicine

## 2024-07-21 ENCOUNTER — Encounter: Payer: Self-pay | Admitting: Family Medicine

## 2024-07-21 ENCOUNTER — Ambulatory Visit (INDEPENDENT_AMBULATORY_CARE_PROVIDER_SITE_OTHER): Admitting: Family Medicine

## 2024-07-21 VITALS — BP 114/70 | Ht 63.0 in | Wt 160.0 lb

## 2024-07-21 DIAGNOSIS — M654 Radial styloid tenosynovitis [de Quervain]: Secondary | ICD-10-CM | POA: Diagnosis not present

## 2024-07-21 NOTE — Progress Notes (Signed)
 DATE OF VISIT: 07/21/2024        Tammy Todd DOB: [birth year] MRN: [id number]  CC:  f/u bilateral Dequervain's  History of present Illness: Tammy Todd is a 43 y.o. female who presents for a follow-up visit  Has been dealing with bilateral de Quervain's tenosynovitis Last seen by me 06/14/2024 - At that visit was recommended hand therapy, ongoing thumb spica brace, Voltaren gel - She is status post prior bilateral de Quervain's cortisone injection by me 04/28/2024  Since last visit she notes that symptoms have gotten slightly worse She is having a lot of pain and difficulty while at work, they have adjusted her duties to help limit her symptoms She has continued to use thumb spica brace at bedtime Did not schedule hand therapy, but has been doing home exercises on a regular basis, but these have been uncomfortable Denies any associated numbness or tingling   Medications:  Outpatient Encounter Medications as of 07/21/2024  Medication Sig   acetaminophen  (TYLENOL ) 500 MG tablet Take 1,000 mg by mouth daily.   albuterol (VENTOLIN HFA) 108 (90 Base) MCG/ACT inhaler SMARTSIG:2 inhalation Via Inhaler Every 4 Hours PRN   Calcium Carbonate (CALCIUM 500 PO) Take 500 mg by mouth 4 (four) times daily.   calcium carbonate (TUMS - DOSED IN MG ELEMENTAL CALCIUM) 500 MG chewable tablet Chew 1 tablet by mouth as needed for indigestion or heartburn.   celecoxib  (CELEBREX ) 200 MG capsule Take 1 capsule (200 mg total) by mouth 2 (two) times daily as needed.   cholecalciferol (VITAMIN D3) 25 MCG (1000 UNIT) tablet Take 1,000 Units by mouth daily.   diazepam (VALIUM) 2 MG tablet SMARTSIG:1 Tablet(s) By Mouth Every 12 Hours PRN   dicyclomine  (BENTYL ) 10 MG capsule Take 1 capsule (10 mg total) by mouth 4 (four) times daily -  before meals and at bedtime.   EPINEPHrine 0.3 mg/0.3 mL IJ SOAJ injection Inject into the muscle.   gabapentin (NEURONTIN) 300 MG capsule Take 300 mg by mouth daily.    loperamide  (IMODIUM  A-D) 2 MG tablet Take 1 tablet (2 mg total) by mouth 4 (four) times daily as needed for diarrhea or loose stools.   Multiple Vitamin (MULTIVITAMIN WITH MINERALS) TABS tablet Take 1 tablet by mouth in the morning and at bedtime.   omeprazole  (PRILOSEC  OTC) 20 MG tablet Take 1 tablet (20 mg total) by mouth daily.   ondansetron  (ZOFRAN -ODT) 4 MG disintegrating tablet Take 1 tablet (4 mg total) by mouth every 8 (eight) hours as needed.   oxyCODONE  (OXY IR/ROXICODONE ) 5 MG immediate release tablet Take 5 mg by mouth every 6 (six) hours as needed.   promethazine -dextromethorphan (PROMETHAZINE -DM) 6.25-15 MG/5ML syrup Take 5 mLs by mouth 4 (four) times daily as needed.   sertraline (ZOLOFT) 25 MG tablet Take 25 mg by mouth daily.   tiZANidine (ZANAFLEX) 2 MG tablet Take 2 mg by mouth 3 (three) times daily as needed.   VITAMIN E PO Take by mouth.   No facility-administered encounter medications on file as of 07/21/2024.    Allergies: is allergic to cashew nut oil and pistachio nut extract.  Physical Examination: Vitals: BP 114/70   Ht 5' 3 (1.6 m)   Wt 160 lb (72.6 kg)   LMP 11/11/2021 (Exact Date)   BMI 28.34 kg/m  GENERAL:  Tammy Todd is a 43 y.o. female appearing their stated age, alert and oriented x 3, in no apparent distress.  SKIN: no rashes or lesions, skin  clean, dry, intact MSK: Hand/wrist: Bilateral hand and wrist with slight soft tissue swelling along the first extensor compartment.  No increased redness or warmth.  Tender to palpation along the first extensor compartment bilaterally.  Positive Finkelstein's bilaterally.  Normal grip strength. Neurovascular intact distally  Assessment & Plan De Quervain's tenosynovitis, left Ongoing bilateral de Quervain's tenosynovitis, has been worsening.  Left side had slight improvement with cortisone injection 04/28/2024, but did not have remarkable relief  Plan: - Patient has had limited improvement despite  cortisone injection, bracing, home exercises.  Recommend referral to Hand Surgery for further evaluation.  Referral to Dr. Agarwala (also known as Dr. Erwin) at [facility] was placed.  May need surgical intervention, versus consideration of repeat trial of injection. - She will continue with bracing at bedtime - Can continue NSAIDs as needed - Will follow-up with me on an as-needed basis De Quervain's tenosynovitis, right Ongoing bilateral de Quervain's tenosynovitis, has been worsening.  No improvement with cortisone injection 04/28/2024  Plan: - Patient has had limited improvement despite cortisone injection, bracing, home exercises.  Recommend referral to Hand Surgery for further evaluation.  Referral to Dr. Agarwala (also known as Dr. Erwin) at [facility] was placed.  May need surgical intervention, versus consideration of repeat trial of injection. - She will continue with bracing at bedtime - Can continue NSAIDs as needed - Will follow-up with me on an as-needed basis   Patient expressed understanding & agreement with above.  Encounter Diagnoses  Name Primary?   De Quervain's tenosynovitis, left Yes   De Quervain's tenosynovitis, right     Orders Placed This Encounter  Procedures   AMB referral to sports medicine

## 2024-07-21 NOTE — Assessment & Plan Note (Addendum)
 Ongoing bilateral de Quervain's tenosynovitis, has been worsening.  Left side had slight improvement with cortisone injection 04/28/2024, but did not have remarkable relief  Plan: - Patient has had limited improvement despite cortisone injection, bracing, home exercises.  Recommend referral to Hand Surgery for further evaluation.  Referral to Dr. Agarwala (also known as Dr. Erwin) at [facility] was placed.  May need surgical intervention, versus consideration of repeat trial of injection. - She will continue with bracing at bedtime - Can continue NSAIDs as needed - Will follow-up with me on an as-needed basis

## 2024-07-26 ENCOUNTER — Telehealth: Payer: Self-pay | Admitting: Orthopedic Surgery

## 2024-07-26 NOTE — Telephone Encounter (Signed)
 Called pt and was unable to leave vm. No voicemail set up. Pt need appt with [facility] for bil hands. Please attach referral

## 2024-08-11 ENCOUNTER — Other Ambulatory Visit: Payer: Self-pay

## 2024-08-11 ENCOUNTER — Ambulatory Visit (INDEPENDENT_AMBULATORY_CARE_PROVIDER_SITE_OTHER): Admitting: Orthopedic Surgery

## 2024-08-11 DIAGNOSIS — M654 Radial styloid tenosynovitis [de Quervain]: Secondary | ICD-10-CM

## 2024-08-11 DIAGNOSIS — M25531 Pain in right wrist: Secondary | ICD-10-CM | POA: Diagnosis not present

## 2024-08-11 DIAGNOSIS — M25532 Pain in left wrist: Secondary | ICD-10-CM | POA: Diagnosis not present

## 2024-08-11 NOTE — Progress Notes (Signed)
 Tammy Todd - 43 y.o. female MRN [id number]  Date of birth: 1981/01/08  Office Visit Note: Visit Date: 08/11/2024 PCP: Perri Constance Sor, PA-C Referred by: Teressa Rainell BROCKS, DO  Subjective: No chief complaint on file.  HPI: Tammy Todd is a pleasant 43 y.o. female who presents today for bilateral wrist/thumb pain along the radial aspect of the hand and wrist. Patient is left-hand dominant but states the right is worse than the left. Has been previously diagnosed with de Quervain's tenosynovitis and undergone had multiple rounds of injections that previously helped, but here lately has not had much relief.  She has also tried bracing extensively without lasting relief.  Pertinent ROS were reviewed with the patient and found to be negative unless otherwise specified above in HPI.   Visit Reason: bilateral de Quervain's tenosynovitis Duration of symptoms: 1+ year Hand dominance: left Occupation: Data processing manager Diabetic: No Smoking: No Heart/Lung History:  none Blood Thinners: none  Prior Testing/EMG: none Injections (Date): 04/28/24 Treatments: injections, braces, OT Prior Surgery: none     Assessment & Plan: Visit Diagnoses:  1. De Quervain's tenosynovitis, right   2. Bilateral wrist pain   3. De Quervain's tenosynovitis, left     Plan: Extensive discussion was had with the patient today regarding her bilateral wrist radial sided pain.  Patient has signs and symptoms consistent with bilateral de Quervain's tenosynovitis.  We discussed the underlying etiology and pathophysiology of this condition as well as treatment modalities ranging from conservative to surgical.  From a conservative standpoint, we discussed activity modification, anti-inflammatory medication both topical and oral, bracing, therapy and cortisone injections.  From a surgical standpoint, I explained to patient that we can perform right wrist first extensor compartment release and possible  tenosynovectomy should symptoms remain affected conservative care.  Given that her symptoms are refractory to extensive conservative care, she is interested in moving forward with bilateral, staged right wrist first extensor compartment release with possible extensor tenosynovectomy.  Risks and benefits of the procedure were discussed, risks including but not limited to infection, bleeding, scarring, stiffness, nerve injury, tendon injury, vascular injury, tendon instability, recurrence of symptoms and need for subsequent operation.  We also discussed the appropriate postoperative protocol and timeframe for return to activities and function.  Patient expressed understanding.  Will move forward with surgical scheduling of right wrist first extensor compartment release and possible extensor tenosynovectomy for the next available date.   Follow-up: No follow-ups on file.   Meds & Orders: No orders of the defined types were placed in this encounter.   Orders Placed This Encounter  Procedures   XR Wrist Complete Left   XR Wrist Complete Right     Procedures: No procedures performed      Clinical History: No specialty comments available.  She reports that she quit smoking about 14 years ago. Her smoking use included cigarettes. She started smoking about 23 years ago. She has a 2.3 pack-year smoking history. She has never used smokeless tobacco. No results for input(s): HGBA1C, LABURIC in the last 8760 hours.  Objective:   Vital Signs: LMP 11/11/2021 (Exact Date)   Physical Exam  Gen: Well-appearing, in no acute distress; non-toxic CV: Regular Rate. Well-perfused. Warm.  Resp: Breathing unlabored on room air; no wheezing. Psych: Fluid speech in conversation; appropriate affect; normal thought process  Ortho Exam General: Patient is well appearing and in no distress. Cervical spine mobility is full in all directions:  Skin and Muscle: No skin  changes are apparent to upper  extremities.  Muscle bulk and contour normal, no signs of atrophy.     Range of Motion and Palpation Tests: Mobility is full about the elbows with flexion and extension.  Forearm supination and pronation are 85/85 bilaterally.  Wrist flexion/extension is 55/45 bilaterally, limited secondary to pain.  Digital flexion and extension are full.    No cords or nodules are palpated.  No triggering is observed.   Finklestein test is positive bilateral, significant pain with associated swelling and tenderness.    Neurologic, Vascular, Motor: Sensation is intact to light touch in the median/radial/ulnar distributions.   Fingers pink and well perfused.  Capillary refill is brisk.     Imaging: No results found.  Past Medical/Family/Surgical/Social History: Medications & Allergies reviewed per EMR, new medications updated. Patient Active Problem List   Diagnosis Date Noted   Left lateral epicondylitis 07/31/2022   De Quervain's tenosynovitis, left 07/31/2022   History of gastric bypass 07/24/2021   Iron  deficiency anemia due to chronic blood loss 04/25/2021   Excessive daytime sleepiness 09/07/2019   Gastroesophageal reflux disease 09/07/2019   Hiatal hernia 09/07/2019   Snoring 09/07/2019   Vitamin D deficiency 12/13/2018   Chronic midline low back pain without sciatica 11/03/2018   Encounter for weight loss counseling 11/03/2018   Generalized anxiety disorder 11/03/2018   Iron  deficiency 04/03/2015   Dysmenorrhea 08/03/2014   Female fertility problem 08/03/2014   Past Medical History:  Diagnosis Date   Abnormal weight gain 11/03/2018   Anemia    Anxiety    Class 3 severe obesity due to excess calories with serious comorbidity and body mass index (BMI) of 45.0 to 49.9 in adult 11/03/2018   Depression    GERD (gastroesophageal reflux disease)    H. pylori infection    Hiatal hernia    Iron  deficiency 04/03/2015   Scoliosis    Family History  Problem Relation Age of Onset    Diabetes Mother    Hyperlipidemia Mother    Hypertension Mother    Alcohol abuse Father    Hypertension Father    Obesity Father    Hyperlipidemia Father    Diabetes Father    Depression Sister    Anxiety disorder Sister    Diabetes Sister    Diabetes Brother    Hyperlipidemia Brother    Hypertension Brother    Obesity Paternal Grandmother    Breast cancer Paternal Aunt    Stomach cancer Paternal Uncle    Past Surgical History:  Procedure Laterality Date   BARIATRIC SURGERY     COLONOSCOPY WITH PROPOFOL  N/A 09/22/2019   Procedure: COLONOSCOPY WITH PROPOFOL ;  Surgeon: Toledo, Ladell POUR, MD;  Location: [facility];  Service: Endoscopy;  Laterality: N/A;   ESOPHAGOGASTRODUODENOSCOPY (EGD) WITH PROPOFOL  N/A 09/22/2019   Procedure: ESOPHAGOGASTRODUODENOSCOPY (EGD) WITH PROPOFOL ;  Surgeon: Toledo, Ladell POUR, MD;  Location: [facility] ENDOS[facility];  Service: Endoscopy;  Laterality: N/A;   GASTROPLASTY DUODENAL SWITCH  10/24/2019   HIATAL HERNIA REPAIR  10/24/2019   LAPAROSCOPIC ASSISTED VAGINAL HYSTERECTOMY N/A 11/22/2021   Procedure: LAPAROSCOPIC ASSISTED VAGINAL HYSTERECTOMY AND BILATERAL SALPHINGECTOMY;  Surgeon: Janit Alm Agent, MD;  Location: [facility] ORS;  Service: Gynecology;  Laterality: N/A;   Social History   Occupational History   Not on file  Tobacco Use   Smoking status: Former    Current packs/day: 0.00    Average packs/day: 0.3 packs/day for 9.0 years (2.3 ttl pk-yrs)    Types: Cigarettes    Start date: 12/2000  Quit date: 12/2009    Years since quitting: 14.6   Smokeless tobacco: Never  Vaping Use   Vaping status: Never Used  Substance and Sexual Activity   Alcohol use: Not Currently    Comment: social   Drug use: No   Sexual activity: Not Currently    Birth control/protection: None    Tyeler Goedken Estela) Arlinda, M.D. [facility], Hand Surgery

## 2024-08-17 ENCOUNTER — Other Ambulatory Visit: Payer: Self-pay

## 2024-08-17 DIAGNOSIS — M654 Radial styloid tenosynovitis [de Quervain]: Secondary | ICD-10-CM

## 2024-08-24 ENCOUNTER — Other Ambulatory Visit: Payer: Self-pay | Admitting: Orthopedic Surgery

## 2024-08-24 MED ORDER — OXYCODONE HCL 5 MG PO TABS: 5.0000 mg | ORAL_TABLET | Freq: Four times a day (QID) | ORAL | 0 refills | Status: AC | PRN

## 2024-08-25 DIAGNOSIS — M654 Radial styloid tenosynovitis [de Quervain]: Secondary | ICD-10-CM | POA: Diagnosis not present

## 2024-08-31 ENCOUNTER — Encounter: Payer: Self-pay | Admitting: Orthopedic Surgery

## 2024-09-05 NOTE — Therapy (Signed)
 OUTPATIENT OCCUPATIONAL THERAPY ORTHO EVALUATION, treatment and discharge  Patient Name: Tammy Todd MRN: [id number] DOB:05/19/1981, 43 y.o., female Today's Date: 09/07/2024  PCP: Perri HERO. PA-C REFERRING PROVIDER:  Arlinda Buster, MD     END OF SESSION:  OT End of Session - 09/07/24 0849     Visit Number 1    Number of Visits 1    Authorization Type BC[employer]    OT Start Time 0854    OT Stop Time 0935    OT Time Calculation (min) 41 min    Equipment Utilized During Treatment orthotic materials    Activity Tolerance Patient tolerated treatment well;No increased pain;Patient limited by pain;Patient limited by fatigue    Behavior During Therapy [facility] for tasks assessed/performed          Past Medical History:  Diagnosis Date   Abnormal weight gain 11/03/2018   Anemia    Anxiety    Class 3 severe obesity due to excess calories with serious comorbidity and body mass index (BMI) of 45.0 to 49.9 in adult (HCC) 11/03/2018   Depression    GERD (gastroesophageal reflux disease)    H. pylori infection    Hiatal hernia    Iron  deficiency 04/03/2015   Scoliosis    Past Surgical History:  Procedure Laterality Date   BARIATRIC SURGERY     COLONOSCOPY WITH PROPOFOL  N/A 09/22/2019   Procedure: COLONOSCOPY WITH PROPOFOL ;  Surgeon: Toledo, Ladell POUR, MD;  Location: [facility] ENDOSCOPY;  Service: Endoscopy;  Laterality: N/A;   ESOPHAGOGASTRODUODENOSCOPY (EGD) WITH PROPOFOL  N/A 09/22/2019   Procedure: ESOPHAGOGASTRODUODENOSCOPY (EGD) WITH PROPOFOL ;  Surgeon: Toledo, Ladell POUR, MD;  Location: [facility] ENDOSCOPY;  Service: Endoscopy;  Laterality: N/A;   GASTROPLASTY DUODENAL SWITCH  10/24/2019   HIATAL HERNIA REPAIR  10/24/2019   LAPAROSCOPIC ASSISTED VAGINAL HYSTERECTOMY N/A 11/22/2021   Procedure: LAPAROSCOPIC ASSISTED VAGINAL HYSTERECTOMY AND BILATERAL SALPHINGECTOMY;  Surgeon: Janit Alm Agent, MD;  Location: [facility] ORS;  Service: Gynecology;  Laterality: N/A;   Patient Active  Problem List   Diagnosis Date Noted   Left lateral epicondylitis 07/31/2022   De Quervain's tenosynovitis, left 07/31/2022   History of gastric bypass 07/24/2021   Iron  deficiency anemia due to chronic blood loss 04/25/2021   Excessive daytime sleepiness 09/07/2019   Gastroesophageal reflux disease 09/07/2019   Hiatal hernia 09/07/2019   Snoring 09/07/2019   Vitamin D deficiency 12/13/2018   Chronic midline low back pain without sciatica 11/03/2018   Encounter for weight loss counseling 11/03/2018   Generalized anxiety disorder 11/03/2018   Iron  deficiency 04/03/2015   Dysmenorrhea 08/03/2014   Female fertility problem 08/03/2014    ONSET DATE: DOS 08/25/24  REFERRING DIAG: M65.4 (ICD-10-CM) - De Quervain's tenosynovitis, right   THERAPY DIAG:  Localized edema  Muscle weakness (generalized)  Stiffness of right wrist, not elsewhere classified  Pain in right wrist  Rationale for Evaluation and Treatment: Rehabilitation  SUBJECTIVE:   SUBJECTIVE STATEMENT: Now @ 2weeks s/p Rt D.Q.R.  She states not having much pain now, just a bit sore.  She states the pain that was involved with her thumb and wrist motion before surgery is now gone.  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  PAIN:  Are you having pain? Yes: NPRS scale: 3/10 at rest now  Pain location: Right surgical area Pain description: Soreness Aggravating factors: Attempted weightbearing Relieving factors: Rest  FALLS: Has patient fallen in last 6 months? No  PLOF: Independent  PATIENT GOALS: To safely improve the use of her right hand/thumb/wrist for work and other activities.  NEXT MD  VISIT: Approximately 4 weeks   OBJECTIVE: (All objective assessments below are from initial evaluation on: 09/07/24 unless otherwise specified.)   HAND DOMINANCE: Right   ADLs: Overall ADLs: States decreased ability to grab, hold household objects, pain and difficulty to open containers, perform FMS tasks (manipulate fasteners on clothing), etc.   UPPER EXTREMITY ROM     Shoulder to Wrist AROM Right eval  Shoulder flexion   Shoulder abduction   Shoulder extension   Shoulder internal rotation   Shoulder external rotation   Elbow flexion WNL  Elbow extension WNL  Forearm supination 69  Forearm pronation  85  Wrist flexion 41  Wrist extension 59  Wrist ulnar deviation 35  Wrist radial deviation (-5)  Functional dart thrower's motion (F-DTM) in ulnar flexion   F-DTM in radial extension    (Blank rows = not tested)   Hand AROM Right eval  Full Fist Ability (or Gap to Distal Palmar Crease) full  Thumb Opposition  (Kapandji Scale)  8/10  (Blank rows = not tested)   UPPER EXTREMITY MMT:    Eval:  NT at eval due to recent and still healing injuries. Expected to improve with HEP and recommendations.   HAND FUNCTION: Eval: Observed weakness in affected right hand/arm, grossly 3-/5 MMT, but specific gripping and resistance training contraindicated today. Expected to improve with HEP and recommendations.    COORDINATION: Eval: Mild observed coordination impairments with affected right hand/arm. Expected to improve with HEP and recommendations.    SENSATION: Eval:  Light touch intact today, though diminished around sx area. Expected to improve with HEP and recommendations.   EDEMA:   Eval:  Mildly swollen in right wrist today.  Expected to improve with HEP and recommendations.   COGNITION: Eval: Overall cognitive status: WFL for evaluation today   OBSERVATIONS:   Eval: Surgical site is clean and no overt signs of infection, no drainage, signs of dehiscence, etc.   Tenderness and swelling is within normal limits for post-op timeframe.     TODAY'S TREATMENT:  Post-evaluation treatment:    Custom orthotic fabrication was indicated due to pt's healing right first dorsal compartment surgical release and need for safe, functional positioning. OT fabricated custom forearm-based thumb spica orthosis for pt today to support the wrist and the base of the thumb. It fit well with no areas of pressure, pt states a comfortable fit. Pt was educated on the wearing schedule (on while at work or if attempting to lift move or carry anything heavier than 5 pounds.), to avoid exposing it to sources of heat, to wipe clean as needed (do not wash, use harsh detergents), to call or come in ASAP if it is causing any irritation or is not achieving desired function. It will be checked/adjusted in upcoming sessions, as needed. Pt states understanding all directions.     For safety/self-care, the patient was recommended to do no pushing/pulling or weightbearing through the right hand or arm now.  The patient was taught to care for the postsurgical wound by doing light touch desensitization, gentle scar mobilizations, keep  moist with a Vaseline, keep covered until completely healed.  The patient should not be soaking the wound, either.  The patient can quickly shower and dab dry.  The patient was given a compressive stockinette to help with swelling.  The patient was also given the following home exercise program to perform in a nonpainful fashion approximately 4 times a day.  Each one was reviewed with the patient, with performance back to show understanding and no significant pain.  The patient leaves without any questions or concerns.    Exercises - Standing Radial Nerve Glide  - 4-6 x daily - 1 sets - 10-15 reps - Turn Palm Facing Up & Down  - 4-6 x daily - 10-15 reps - Bend and Pull Back Wrist SLOWLY  - 4 x daily - 10-15 reps - Windshield Wipers   - 4 x daily - 10-15 reps - Tendon  Glides  - 4-6 x daily - 3-5 reps - 2-3 seconds hold - Thumb Opposition  - 4-6 x daily - 10 reps - Wrist Flexion Stretch  - 4 x daily - 3-5 reps - 15 sec hold - Wrist Prayer Stretch  - 4 x daily - 3-5 reps - 15 sec hold  Patient Education - Scar Massage    PATIENT EDUCATION: Education details: See tx section above for details  Person educated: Patient Education method: Verbal Instruction, Teach back, Handouts  Education comprehension: States and demonstrates understanding   HOME EXERCISE PROGRAM: Access Code: [email or web address] URL: [email or web address] Date: 09/07/2024 Prepared by: Melvenia Ada   GOALS: Goals reviewed with patient? Yes   SHORT TERM GOALS: (STG required if POC>30 days) Target Date: 09/07/24  1.  Pt will demo/state understanding of initial HEP and therapist recommendations to improve pain levels, improve motions and ability and eventually return to normal activities.   Goal status: MET  2. Pt will obtain protective, custom orthotic to prevent injury to surgical site and prevent soft-tissue contractures.  Goal status: MET     ASSESSMENT:  CLINICAL IMPRESSION: Patient is a 43 y.o. female who was seen today for occupational therapy evaluation for swelling, pain, weakness and decreased functional ability following right de Quervain's release procedure. The patient is appropriate for OT rehab services and benefited from treatment today. The patient got copious education/treatment today for self-care, wound management, exercises and how to transition to normal activities in the next 4-6 weeks. The patient agrees that they can manage these recommendations independently, and should not need to return for follow up visits. The patient should follow up with the surgeon with any concerns, and could possibly return to therapy, if needed, with a new order.  The patient will discharge therapy treatment after this visit.     PERFORMANCE DEFICITS: in functional  skills including ADLs, IADLs, coordination, dexterity, sensation, edema, ROM, strength, pain, fascial restrictions, flexibility, Fine motor control, body mechanics, endurance, decreased knowledge of precautions, wound, and UE functional use, cognitive skills including problem solving and safety awareness, and psychosocial skills including coping strategies, environmental adaptation, and habits.   IMPAIRMENTS: are limiting patient from ADLs, IADLs, rest and sleep, leisure, and social participation.   COMORBIDITIES: may have co-morbidities  that affects occupational performance. Patient will benefit from skilled OT to address above impairments and improve overall function.  MODIFICATION OR ASSISTANCE TO COMPLETE EVALUATION: No modification of tasks or assist necessary to complete an evaluation.  OT OCCUPATIONAL PROFILE AND HISTORY: Problem focused assessment: Including review of records relating to presenting problem.  CLINICAL DECISION MAKING: LOW -  limited treatment options, no task modification necessary  REHAB POTENTIAL: Excellent  EVALUATION COMPLEXITY: Low      PLAN:  OT FREQUENCY: one time visit  OT DURATION: 1 sessions  PLANNED INTERVENTIONS: self care/ADL training, therapeutic exercise, therapeutic activity, neuromuscular re-education, manual therapy, scar mobilization, passive range of motion, splinting, ultrasound, fluidotherapy, compression bandaging, moist heat, cryotherapy, contrast bath, patient/family education, energy conservation, coping strategies training, and Re-evaluation  RECOMMENDED OTHER SERVICES: none now   CONSULTED AND AGREED WITH PLAN OF CARE: Patient  PLAN FOR NEXT SESSION:   N/A    Melvenia Ada, OTR/L, CHT 09/07/2024, 11:41 AM

## 2024-09-07 ENCOUNTER — Encounter: Payer: Self-pay | Admitting: Rehabilitative and Restorative Service Providers"

## 2024-09-07 ENCOUNTER — Ambulatory Visit: Admitting: Rehabilitative and Restorative Service Providers"

## 2024-09-07 ENCOUNTER — Ambulatory Visit: Admitting: Orthopedic Surgery

## 2024-09-07 DIAGNOSIS — M25632 Stiffness of left wrist, not elsewhere classified: Secondary | ICD-10-CM

## 2024-09-07 DIAGNOSIS — M25631 Stiffness of right wrist, not elsewhere classified: Secondary | ICD-10-CM

## 2024-09-07 DIAGNOSIS — M6281 Muscle weakness (generalized): Secondary | ICD-10-CM

## 2024-09-07 DIAGNOSIS — M25531 Pain in right wrist: Secondary | ICD-10-CM

## 2024-09-07 DIAGNOSIS — M25532 Pain in left wrist: Secondary | ICD-10-CM

## 2024-09-07 DIAGNOSIS — R600 Localized edema: Secondary | ICD-10-CM

## 2024-09-07 DIAGNOSIS — M654 Radial styloid tenosynovitis [de Quervain]: Secondary | ICD-10-CM

## 2024-09-07 NOTE — Patient Instructions (Signed)
  Nate's deQuervain's Release Recommendations   Do not to soak your wound for at least 1 week, or until it looks well healed. Wash hand or shower quickly, then dab dry gently. You can put a Vaseline or plain lotion on your closed wound to help the scar heal smoothly. (Don't leave it looking dry and crusty.)   Avoid any strong gripping, push, pull, weight bearing or repetitive motion for the next month.  (If it hurts very badly- don't do it!) After a month, you can progressively return to all normal, light activities. Sports and heavy weightlifting should be withheld for about 3 months.   Gently rub/touch the surface of your scar 4-6x day to desensitize it or "normalize" your sensation. Once your surgical wound is looking healed and closed (about 3 days after stitches are out) start touching/rubbing your scar trying to shift the top layer of your skin. It's ok to do this gently over top of steri-strips. Do not remove steri-strips, but wait for them to fall off naturally. Rub, touch gently for 2-3 minutes, 3-4 x day.      If you have any tingling or itching/burning around your scar, just lightly rub/brush it with a cloth for 2-3 mins to calm your nerves.  Begin with gentle motion exercises about 4 times a day, non-painfully, feeling light to medium tension.  (See exercise sheet)  Wait 2-4 weeks after surgery to use "squeeze balls" or "hand grippers"  Wait 4-6 weeks after surgery to start strengthening in your arm in a gym setting   In general, keep your hand moving, and don't let it heal stiffly.  If you're sore- slow down!   If you're stiff- speed up!     If you are having any new problems, lingering stiffness, pain, signs of infection (redness, swelling, oozing puss, tenderness), contact your surgeon immediately.  He may send you to formal hand therapy.

## 2024-09-07 NOTE — Progress Notes (Signed)
   Tammy Todd - 43 y.o. female MRN [id number]  Date of birth: 29-May-1981  Office Visit Note: Visit Date: 09/07/2024 PCP: Perri Constance Sor, PA-C Referred by: Perri Constance Sor, P*  Subjective:  HPI: Adell Koval is a 43 y.o. female who presents today for follow up 2 weeks status post right extensor compartment release with associated tenosynovectomy.  She is doing well overall, pain is controlled.  Has been compliant with splinting as instructed.  Pertinent ROS were reviewed with the patient and found to be negative unless otherwise specified above in HPI.   Assessment & Plan: Visit Diagnoses: No diagnosis found.  Plan: Splint removed today, wound site is well-healing.  She will be seen by occupational therapy later today to begin range of motion exercises and for fabrication of a removable orthosis.  She can return to work with a 5 pound weight restriction, utilize splint while at work as well.  Follow-up with myself in approxi-4 weeks.  Follow-up: No follow-ups on file.   Meds & Orders: No orders of the defined types were placed in this encounter.  No orders of the defined types were placed in this encounter.    Procedures: No procedures performed       Objective:   Vital Signs: LMP 11/11/2021 (Exact Date)   Ortho Exam Right wrist: - Well-healed incision at the glabrous/nonglabrous juncture over the radial wrist - Thumb opposition to the small finger DPC - Thumb circumduction without significant pain or crepitus - Hand is warm well-perfused, sensation intact in all distributions including DRSN   Imaging: No results found.   Tacari Repass Afton Alderton, M.D. [facility], Hand Surgery

## 2024-09-19 ENCOUNTER — Encounter: Payer: Self-pay | Admitting: Radiology

## 2024-10-09 NOTE — Progress Notes (Unsigned)
   Tammy Todd - 43 y.o. female MRN [id number]  Date of birth: 22-Apr-1981  Office Visit Note: Visit Date: 10/10/2024 PCP: Perri Constance Sor, PA-C Referred by: Perri Constance Sor, P*  Subjective:  HPI: Tammy Todd is a 43 y.o. female who presents today for follow up 6 weeks status post right wrist first extensor compartment release with associated tenosynovectomy.  Pertinent ROS were reviewed with the patient and found to be negative unless otherwise specified above in HPI.   Assessment & Plan: Visit Diagnoses: No diagnosis found.  Plan: ***  Follow-up: No follow-ups on file.   Meds & Orders: No orders of the defined types were placed in this encounter.  No orders of the defined types were placed in this encounter.    Procedures: No procedures performed       Objective:   Vital Signs: LMP 11/11/2021 (Exact Date)   Ortho Exam ***  Imaging: No results found.   Yohann Curl Afton Alderton, M.D. [facility], Hand Surgery

## 2024-10-10 ENCOUNTER — Ambulatory Visit: Admitting: Orthopedic Surgery

## 2024-10-10 DIAGNOSIS — M654 Radial styloid tenosynovitis [de Quervain]: Secondary | ICD-10-CM
